# Patient Record
Sex: Female | Born: 1980 | Race: Black or African American | Hispanic: No | Marital: Single | Smoking: Never smoker
Health system: Southern US, Community
[De-identification: ages and names within clinical notes are randomized; demographics above are authoritative.]

## PROBLEM LIST (undated history)

## (undated) DIAGNOSIS — R519 Headache, unspecified: Secondary | ICD-10-CM

## (undated) DIAGNOSIS — D649 Anemia, unspecified: Secondary | ICD-10-CM

## (undated) DIAGNOSIS — Z9889 Other specified postprocedural states: Secondary | ICD-10-CM

## (undated) DIAGNOSIS — Z86018 Personal history of other benign neoplasm: Secondary | ICD-10-CM

## (undated) DIAGNOSIS — R112 Nausea with vomiting, unspecified: Secondary | ICD-10-CM

## (undated) DIAGNOSIS — J45909 Unspecified asthma, uncomplicated: Secondary | ICD-10-CM

## (undated) DIAGNOSIS — R51 Headache: Secondary | ICD-10-CM

## (undated) DIAGNOSIS — Z8489 Family history of other specified conditions: Secondary | ICD-10-CM

## (undated) HISTORY — PX: UTERINE FIBROID SURGERY: SHX826

---

## 2009-11-11 HISTORY — PX: MYOMECTOMY: SHX85

## 2014-04-05 DIAGNOSIS — M1990 Unspecified osteoarthritis, unspecified site: Secondary | ICD-10-CM | POA: Insufficient documentation

## 2014-08-22 ENCOUNTER — Ambulatory Visit (INDEPENDENT_AMBULATORY_CARE_PROVIDER_SITE_OTHER)
Admission: RE | Admit: 2014-08-22 | Discharge: 2014-08-22 | Disposition: A | Discharge status: Home / Self Care | Payer: BC Managed Care – PPO | Source: Ambulatory Visit | Attending: Family Medicine | Admitting: Family Medicine

## 2014-08-22 ENCOUNTER — Ambulatory Visit (INDEPENDENT_AMBULATORY_CARE_PROVIDER_SITE_OTHER): Payer: BC Managed Care – PPO | Admitting: Family Medicine

## 2014-08-22 ENCOUNTER — Encounter: Payer: Self-pay | Admitting: Family Medicine

## 2014-08-22 VITALS — BP 118/82 | HR 87 | Ht 67.0 in | Wt 154.0 lb

## 2014-08-22 DIAGNOSIS — M533 Sacrococcygeal disorders, not elsewhere classified: Secondary | ICD-10-CM

## 2014-08-22 MED ORDER — DICLOFENAC SODIUM 2 % TD SOLN: TRANSDERMAL | Status: DC

## 2014-08-22 NOTE — Progress Notes (Signed)
  [facility] [place], [place] [address] Phone: [phone] Subjective:    I'm seeing this patient by the request  of:   Dr. Deloria Lair  CC: Low back pain  HDQ:[id number] Michelle Hardy is a 33 y.o. female coming in with complaint of low back pain. Patient states that she has been diagnosed with sacroiliitis previously. Patient has had flares over the course of multiple years in my severe flare was back in March. Patient went into the walk-in clinic  on October 1 for a chronic burning sensation she was having in the left hip area. Patient has had multiple times with she's had gone in and have either a prescription for prednisone or given an injection to help with the pain. Patient states he usually takes more between 5-7 days to completely resolved. Patient states that at this moment she is not having any significant pain but when it does occur she does have radicular symptoms going down her leg but denies any numbness. Patient is a severe pain is 9/10 and is unable to ambulate when it does occur she states. Patient is wondering what she can do to help prevent this pain and see if there is any further diagnosis that can be obtained.    Past medical history, social, surgical and family history all reviewed in electronic medical record.   Patient's family history is is consistent with her dad having rheumatoid arthritis.  Review of Systems: No headache, visual changes, nausea, vomiting, diarrhea, constipation, dizziness, abdominal pain, skin rash, fevers, chills, night sweats, weight loss, swollen lymph nodes, body aches, joint swelling, muscle aches, chest pain, shortness of breath, mood changes.   Objective Blood pressure 118/82, pulse 87, height 5\' 7"  (1.702 m), weight 154 lb (69.854 kg), last menstrual period 08/16/2014, SpO2 99.00%.  General: No apparent distress alert and oriented x3 mood and affect normal, dressed appropriately.  HEENT: Pupils equal,  extraocular movements intact  Respiratory: Patient's speak in full sentences and does not appear short of breath  Cardiovascular: No lower extremity edema, non tender, no erythema  Skin: Warm dry intact with no signs of infection or rash on extremities or on axial skeleton.  Abdomen: Soft nontender  Neuro: Cranial nerves II through XII are intact, neurovascularly intact in all extremities with 2+ DTRs and 2+ pulses.  Lymph: No lymphadenopathy of posterior or anterior cervical chain or axillae bilaterally.  Gait normal with good balance and coordination.  MSK:  Non tender with full range of motion and good stability and symmetric strength and tone of shoulders, elbows, wrist, hip, knee and ankles bilaterally.  Back Exam:  Inspection: Unremarkable  Motion: Flexion 45 deg, Extension 35 deg, Side Bending to 35 deg bilaterally,  Rotation to 45 deg bilaterally  SLR laying: Negative  XSLR laying: Negative  Palpable tenderness: Mildly tender over the right SI joint FABER: negative. Sensory change: Gross sensation intact to all lumbar and sacral dermatomes.  Reflexes: 2+ at both patellar tendons, 2+ at achilles tendons, Babinski's downgoing.  Strength at foot  Plantar-flexion: 5/5 Dorsi-flexion: 5/5 Eversion: 5/5 Inversion: 5/5  Leg strength  Quad: 5/5 Hamstring: 5/5 Hip flexor: 5/5 Hip abductors: 4/5  Gait unremarkable.     Impression and Recommendations:     This case required medical decision making of moderate complexity.

## 2014-08-22 NOTE — Assessment & Plan Note (Signed)
Patient seemed to have the pain resolved at this time. Patient is not having any significant radicular symptoms and on exam today has a negative straight leg test. Differential includes sacroiliac joint dysfunction and we'll keep sacroiliitis in line. We will order x-rays today the back as well as the sacroiliac joint to see if there is any sclerotic changes that could help [place] with that diagnosis. Patient denies any other systemic findings that usually go with sacroiliitis such as gastroenterology complaints, or anterior uveitis difficulties. No need for manipulation today.  Topical NSAIDs today and patient was given recommendation over-the-counter medications he can be beneficial. We discussed icing protocol ensured proper technique of home exercises which she'll do on a daily basis. Patient will followup in 2-3 weeks for further evaluation. Patient could be a candidate for osteopathic manipulation if necessary.

## 2014-08-22 NOTE — Patient Instructions (Signed)
Good to meet you Ice 20 minutes 2 times daily. Usually after activity and before bed. Exercises  Daily Sacroiliac Joint Mobilization and Rehab 1. Work on pretzel stretching, shoulder back and leg draped in front. 3-5 sets, 30 sec.. 2. hip abductor rotations. standing, hip flexion and rotation outward then inward. 3 sets, 15 reps. when can do comfortably, add ankle weights starting at 2 pounds.  3. cross over stretching - shoulder back to ground, same side leg crossover. 3-5 sets for 30 min..  4. rolling up and back knees to chest and rocking. 5. sacral tilt - 5 sets, hold for 5-10 seconds Exercises on wall.  Heel and butt touching.  Raise leg 6 inches and hold 2 seconds.  Down slow for count of 4 seconds.  1 set of 30 reps daily on both sides.  Pennsaid twice daily as needed VItamin D 2000 IU daily Turmeric 500mg  twice daily See me again in 2 weeks.

## 2014-09-09 ENCOUNTER — Ambulatory Visit: Payer: BC Managed Care – PPO | Admitting: Family Medicine

## 2014-09-09 DIAGNOSIS — Z0289 Encounter for other administrative examinations: Secondary | ICD-10-CM

## 2015-04-26 DIAGNOSIS — L309 Dermatitis, unspecified: Secondary | ICD-10-CM | POA: Insufficient documentation

## 2015-04-26 DIAGNOSIS — L858 Other specified epidermal thickening: Secondary | ICD-10-CM | POA: Insufficient documentation

## 2015-05-01 ENCOUNTER — Ambulatory Visit (INDEPENDENT_AMBULATORY_CARE_PROVIDER_SITE_OTHER): Payer: BLUE CROSS/BLUE SHIELD | Admitting: Family Medicine

## 2015-05-01 ENCOUNTER — Encounter: Payer: Self-pay | Admitting: Family Medicine

## 2015-05-01 VITALS — BP 130/82 | HR 107 | Temp 98.6°F | Resp 18 | Ht 67.75 in | Wt 154.2 lb

## 2015-05-01 DIAGNOSIS — Z23 Encounter for immunization: Secondary | ICD-10-CM

## 2015-05-01 DIAGNOSIS — M545 Low back pain, unspecified: Secondary | ICD-10-CM

## 2015-05-01 DIAGNOSIS — Z0000 Encounter for general adult medical examination without abnormal findings: Secondary | ICD-10-CM | POA: Diagnosis not present

## 2015-05-01 NOTE — Progress Notes (Signed)
Subjective:    Patient ID: Michelle Hardy, female    DOB: Oct 27, 1981, 34 y.o.   MRN: [id number]  HPI Chief Complaint  Patient presents with  . Annual Exam has intermittent low back "spasms" in the mornings (2:30 am). No pain Thurs, Fri and Sat the past week. History of orthopedic and rheumatoid evaluations without specific diagnosis.     -  Routine PAP and GYN exam by Dr. Amalia Hailey at [facility].       Presently on BCP, Ponstel and Spironolactone. Has      used Fluoxetine for premenstrual syndrome in the past.   Patient Active Problem List   Diagnosis Date Noted  . Keratosis pilaris 04/26/2015  . 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 Arthritis 04/05/2014    -  Asthma flares with extremes in hot or cold                     weather/temperatures - usually controled with Ventolin.  Past Surgical History  Procedure Laterality Date  . Myomectomy  2011   History   Social History  . Marital Status: Single    Spouse Name: N/A  . Number of Children: N/A  . Years of Education: N/A   Occupational History  . Not on file.   Social History Main Topics  . Smoking status: Never Smoker   . Smokeless tobacco: Not on file  . Alcohol Use: No  . Drug Use: No  . Sexual Activity: Not on file   Other Topics Concern  . Not on file   Social History Narrative  Job involves desk work primarily. No exercise regularly since September 2015 due to low back pain.   Family History  Problem Relation Age of Onset  . Fibromyalgia Mother   . Diabetes Father   . Arthritis Father    Outpatient Encounter Prescriptions as of 05/01/2015  Medication Sig Note  . budesonide-formoterol (SYMBICORT) 160-4.5 MCG/ACT inhaler Inhale 1 puff into the lungs 2 (two) times daily. 04/26/2015: Received from: [place]: Inhale into the lungs.  . Mefenamic Acid 250 MG CAPS Take 1 capsule by mouth as needed. 04/26/2015: Received from: [place]: Take by mouth.  . spironolactone (ALDACTONE) 50 MG tablet 1 tablet daily. 05/01/2015: Received from: External Pharmacy Received Sig:   . TRI-SPRINTEC 0.18/0.215/0.25 MG-35 MCG tablet 1 tablet daily. 05/01/2015: Received from: External Pharmacy Received Sig:    No facility-administered encounter medications on file as of 05/01/2015.    No Known Allergies   Review of Systems  Constitutional: Negative.   HENT: Negative.   Respiratory: Negative.        Occasional wheeze  Cardiovascular: Negative.   Gastrointestinal: Negative.   Endocrine: Negative.   Genitourinary: Negative.   Musculoskeletal: Positive for myalgias.  Neurological: Negative.   Psychiatric/Behavioral: Negative.        Irritability at beginning of the menstrual cycle - was on Fluoxetine in the past with good control. Will contact GYN about a refill.   BP 130/82 mmHg  Pulse 107  Temp(Src) 98.6 F (37 C) (Oral)  Resp 18  Ht 5' 7.75" (1.721 m)  Wt 154 lb 3.2 oz (69.945 kg)  BMI 23.62 kg/m2  SpO2 100%  LMP 04/12/2015     Objective:   Physical Exam  Constitutional: She is oriented to person, place, and time. She appears well-developed and well-nourished.  HENT:  Head: Normocephalic and atraumatic.  Right Ear: External  ear normal.  Left Ear: External ear normal.  Mouth/Throat: Oropharynx is clear and moist.  Eyes: Conjunctivae and EOM are normal. Pupils are equal, round, and reactive to light.  Neck: Normal range of motion. Neck supple.  Cardiovascular: Normal rate and intact distal pulses.   Pulmonary/Chest: Effort normal and breath sounds normal.  Abdominal: Soft. Bowel sounds are normal.  Genitourinary:  Exam deferred to GYN annually.  Musculoskeletal: Normal range of motion.  Soreness to palpate mid back musculature. Good ROM with some stiffness. No radiation to extremities.  Neurological: She is alert and oriented to person, place, and time. She has normal reflexes.  Skin:  Skin is warm and dry.  Psychiatric:  Mood swings with menstrual cycles.      Assessment & Plan:   1. Annual physical exam General health stable. Will get routine labs. Proceed with GYN evaluation annually. Given anticipatory guidance. May need to get back on Fluoxetine for PMDD. - CBC with Differential/Platelet - Comprehensive metabolic panel - Lipid panel - TSH  2. Midline low back pain without sciatica Recent mid to upper lumbar spasms in the mornings. Suspect muscular strain. May use moist heat and NSAID of choice. - Sed Rate (ESR)  3. Need for Tdap vaccination - Tdap vaccine greater than or equal to 7yo IM

## 2015-05-05 LAB — CBC WITH DIFFERENTIAL/PLATELET
BASOS ABS: 0 10*3/uL (ref 0.0–0.2)
Basos: 0 %
EOS (ABSOLUTE): 0.1 10*3/uL (ref 0.0–0.4)
EOS: 2 %
HEMOGLOBIN: 9.8 g/dL — AB (ref 11.1–15.9)
Hematocrit: 30.7 % — ABNORMAL LOW (ref 34.0–46.6)
Immature Grans (Abs): 0 10*3/uL (ref 0.0–0.1)
Immature Granulocytes: 0 %
LYMPHS: 31 %
Lymphocytes Absolute: 1.1 10*3/uL (ref 0.7–3.1)
MCH: 21.4 pg — ABNORMAL LOW (ref 26.6–33.0)
MCHC: 31.9 g/dL (ref 31.5–35.7)
MCV: 67 fL — ABNORMAL LOW (ref 79–97)
Monocytes Absolute: 0.3 10*3/uL (ref 0.1–0.9)
Monocytes: 9 %
NEUTROS PCT: 58 %
Neutrophils Absolute: 2 10*3/uL (ref 1.4–7.0)
Platelets: 373 10*3/uL (ref 150–379)
RBC: 4.58 x10E6/uL (ref 3.77–5.28)
RDW: 18.5 % — ABNORMAL HIGH (ref 12.3–15.4)
WBC: 3.4 10*3/uL (ref 3.4–10.8)

## 2015-05-05 LAB — SEDIMENTATION RATE: SED RATE: 42 mm/h — AB (ref 0–32)

## 2015-05-06 LAB — COMPREHENSIVE METABOLIC PANEL
ALT: 10 IU/L (ref 0–32)
AST: 11 IU/L (ref 0–40)
Albumin/Globulin Ratio: 1.3 (ref 1.1–2.5)
Albumin: 4.2 g/dL (ref 3.5–5.5)
Alkaline Phosphatase: 45 IU/L (ref 39–117)
BUN / CREAT RATIO: 9 (ref 8–20)
BUN: 7 mg/dL (ref 6–20)
Bilirubin Total: <0.2 mg/dL (ref 0.0–1.2)
CALCIUM: 9.5 mg/dL (ref 8.7–10.2)
CO2: 22 mmol/L (ref 18–29)
Chloride: 100 mmol/L (ref 97–108)
Creatinine, Ser: 0.74 mg/dL (ref 0.57–1.00)
GFR calc Af Amer: 122 mL/min/{1.73_m2} (ref >59)
GFR, EST NON AFRICAN AMERICAN: 106 mL/min/{1.73_m2} (ref >59)
GLUCOSE: 79 mg/dL (ref 65–99)
Globulin, Total: 3.2 g/dL (ref 1.5–4.5)
Potassium: 4.7 mmol/L (ref 3.5–5.2)
Sodium: 137 mmol/L (ref 134–144)
TOTAL PROTEIN: 7.4 g/dL (ref 6.0–8.5)

## 2015-05-06 LAB — LIPID PANEL
CHOLESTEROL TOTAL: 174 mg/dL (ref 100–199)
Chol/HDL Ratio: 3 ratio units (ref 0.0–4.4)
HDL: 58 mg/dL (ref >39)
LDL Calculated: 99 mg/dL (ref 0–99)
Triglycerides: 87 mg/dL (ref 0–149)
VLDL Cholesterol Cal: 17 mg/dL (ref 5–40)

## 2015-05-06 LAB — TSH: TSH: 3.04 u[IU]/mL (ref 0.450–4.500)

## 2015-05-08 ENCOUNTER — Telehealth: Payer: Self-pay

## 2015-05-08 NOTE — Telephone Encounter (Signed)
Patient advised as directed below. Patient verbalized understanding and agrees with treatment plan.   Patient states she will stop by the office on Friday to pick up OC-Light kit and lab requisition.

## 2015-05-08 NOTE — Telephone Encounter (Signed)
-----  Message from Margo Common, [place] sent at 05/05/2015  6:19 PM EDT ----- Sedimentation rate elevated which indicates inflammation in back. May use Ibuprofen or Aleve with Percogesic for spasms and applications of moist heating pad. Hgb and Hct low (anemic). Recommend coming by to pick up an OC-Light kit to test stools for blood at home. Also need to get blood tests for iron.

## 2015-05-12 ENCOUNTER — Telehealth: Payer: Self-pay

## 2015-05-12 NOTE — Telephone Encounter (Signed)
Left message on machine at home to call back if no better on present regimen.

## 2015-05-12 NOTE — Telephone Encounter (Signed)
Patient came in today to pick up [place], and she reports that her upper back pain is getting worse. She reports that over the past 3 days she has done what you recommended (moist heat, OTC Ibuprofen, etc.), but she has not had any relief. Patient reports that it is becoming more difficult to move her head from side to side. Patient wants to know does she give current treatment a little more time, or is there something else that we can do? Please advise. Contact number listed in chart is correct. Thanks!

## 2015-05-18 ENCOUNTER — Other Ambulatory Visit (INDEPENDENT_AMBULATORY_CARE_PROVIDER_SITE_OTHER): Payer: BLUE CROSS/BLUE SHIELD | Admitting: Family Medicine

## 2015-05-18 DIAGNOSIS — D649 Anemia, unspecified: Secondary | ICD-10-CM | POA: Diagnosis not present

## 2015-05-18 LAB — IFOBT (OCCULT BLOOD): IFOBT: NEGATIVE

## 2015-05-19 NOTE — Progress Notes (Signed)
No evidence of blood in stools. Recommend taking Slow-Fe (OTC iron) qd and recheck blood count in a month. Recheck sooner if needed.

## 2015-05-19 NOTE — Progress Notes (Signed)
Patient advised as directed below. Patient verbalized understanding.  

## 2015-06-12 ENCOUNTER — Other Ambulatory Visit: Payer: Self-pay

## 2015-06-12 ENCOUNTER — Ambulatory Visit (INDEPENDENT_AMBULATORY_CARE_PROVIDER_SITE_OTHER): Payer: BLUE CROSS/BLUE SHIELD | Admitting: Family Medicine

## 2015-06-12 ENCOUNTER — Encounter: Payer: Self-pay | Admitting: Family Medicine

## 2015-06-12 VITALS — BP 102/64 | HR 68 | Temp 98.3°F | Resp 14 | Wt 155.8 lb

## 2015-06-12 DIAGNOSIS — L02429 Furuncle of limb, unspecified: Secondary | ICD-10-CM | POA: Diagnosis not present

## 2015-06-12 MED ORDER — SULFAMETHOXAZOLE-TRIMETHOPRIM 800-160 MG PO TABS: 1.0000 | ORAL_TABLET | Freq: Two times a day (BID) | ORAL | Status: DC

## 2015-06-12 NOTE — Progress Notes (Signed)
Patient ID: Michelle Hardy, female   DOB: [birth year], 34 y.o.   MRN: [id number]   Patient: Michelle Hardy Female    DOB: January 08, 1981   34 y.o.   MRN: [id number] Visit Date: 06/12/2015  Today's Provider: Vernie Murders, PA   Chief Complaint  Patient presents with  . Mass    under right arm X 5 days, No pain   Subjective:    HPI This 34 year old female had someone notice a lump under the right arm that is not painful or red. Last breast exam by Dr. Kenton Kingfisher with PAP was over a year ago. Never any previous lumps or masses under arms or in breasts.    History reviewed. No pertinent past medical history. Patient Active Problem List   Diagnosis Date Noted  . Keratosis pilaris 04/26/2015  . 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 Arthritis 04/05/2014   Past Surgical History  Procedure Laterality Date  . Myomectomy  2011   Family History  Problem Relation Age of Onset  . Fibromyalgia Mother   . Diabetes Father   . Arthritis Father    No Known Allergies  Previous Medications   BUDESONIDE-FORMOTEROL (SYMBICORT) 160-4.5 MCG/ACT INHALER    Inhale 1 puff into the lungs 2 (two) times daily.   DENTA 5000 PLUS 1.1 % CREA DENTAL CREAM       FLUOCINOLONE ACETONIDE SCALP 0.01 % OIL       KETOCONAZOLE (NIZORAL) 2 % SHAMPOO       MEFENAMIC ACID 250 MG CAPS    Take 1 capsule by mouth as needed.   SPIRONOLACTONE (ALDACTONE) 50 MG TABLET    1 tablet daily.   TRI-SPRINTEC 0.18/0.215/0.25 MG-35 MCG TABLET    1 tablet daily.    Review of Systems  All other systems reviewed and are negative.   History  Substance Use Topics  . Smoking status: Never Smoker   . Smokeless tobacco: Not on file  . Alcohol Use: No   Objective:   BP 102/64 mmHg  Pulse 68  Temp(Src) 98.3 F (36.8 C) (Oral)  Resp 14  Wt 155 lb 12.8 oz (70.67 kg)  LMP 06/08/2015  Physical Exam  Constitutional: She is oriented to person, place, and time. She appears well-developed and  well-nourished. No distress.  HENT:  Head: Normocephalic and atraumatic.  Right Ear: Hearing normal.  Left Ear: Hearing normal.  Nose: Nose normal.  Eyes: Conjunctivae and lids are normal. Right eye exhibits no discharge. Left eye exhibits no discharge. No scleral icterus.  Pulmonary/Chest: Effort normal. No respiratory distress.  Musculoskeletal: Normal range of motion.  Neurological: She is alert and oriented to person, place, and time.  Skin: Skin is intact. No lesion and no rash noted.  Small furuncle/large pimple in the right axilla. No local lymphadenopathy.  Psychiatric: She has a normal mood and affect. Her speech is normal and behavior is normal. Thought content normal.      Assessment & Plan:     1. Furuncle of axilla Recent discovery without discomfort. Some slight purulent discharge expressed. No local lymphadenopathy. Will treat with antibiotic and warm compressed. Discard razors recently used and roll-on deodorants. Recheck after finishing antibiotic if needed.

## 2016-11-22 ENCOUNTER — Other Ambulatory Visit: Payer: Self-pay

## 2016-11-22 ENCOUNTER — Encounter: Payer: Self-pay | Admitting: Family Medicine

## 2016-11-22 ENCOUNTER — Ambulatory Visit (INDEPENDENT_AMBULATORY_CARE_PROVIDER_SITE_OTHER): Payer: 59 | Admitting: Family Medicine

## 2016-11-22 VITALS — BP 102/60 | HR 83 | Temp 98.1°F | Resp 14 | Ht 68.0 in | Wt 158.8 lb

## 2016-11-22 DIAGNOSIS — J4520 Mild intermittent asthma, uncomplicated: Secondary | ICD-10-CM | POA: Diagnosis not present

## 2016-11-22 DIAGNOSIS — D259 Leiomyoma of uterus, unspecified: Secondary | ICD-10-CM

## 2016-11-22 DIAGNOSIS — Z0000 Encounter for general adult medical examination without abnormal findings: Secondary | ICD-10-CM | POA: Diagnosis not present

## 2016-11-22 DIAGNOSIS — J45909 Unspecified asthma, uncomplicated: Secondary | ICD-10-CM | POA: Insufficient documentation

## 2016-11-22 MED ORDER — ALBUTEROL SULFATE HFA 108 (90 BASE) MCG/ACT IN AERS: 2.0000 | INHALATION_SPRAY | Freq: Four times a day (QID) | RESPIRATORY_TRACT | 0 refills | Status: DC | PRN

## 2016-11-22 NOTE — Progress Notes (Signed)
Patient: Michelle Hardy, Female    DOB: 08-09-1981, 36 y.o.   MRN: [id number] Visit Date: 11/22/2016  Today's Provider: Vernie Murders, PA   Chief Complaint  Patient presents with  . Annual Exam   Subjective:    Annual physical exam Michelle Hardy is a 36 y.o. female who presents today for health maintenance and complete physical. She feels fairly well. She reports exercising none. She reports she is sleeping poorly.  -----------------------------------------------------------------   Review of Systems  Constitutional: Negative.   HENT: Positive for nosebleeds and rhinorrhea.   Eyes: Negative.   Respiratory: Positive for cough, chest tightness, shortness of breath and wheezing.   Cardiovascular: Negative.   Gastrointestinal: Positive for constipation.  Endocrine: Positive for polydipsia and polyphagia.  Genitourinary: Negative.   Musculoskeletal: Positive for back pain.  Skin: Negative.   Allergic/Immunologic: Negative.   Neurological: Negative.   Hematological: Negative.   Psychiatric/Behavioral: Positive for sleep disturbance.    Social History      She  reports that she has never smoked. She does not have any smokeless tobacco history on file. She reports that she does not drink alcohol or use drugs.       Social History   Social History  . Marital status: Single    Spouse name: N/A  . Number of children: N/A  . Years of education: N/A   Social History Main Topics  . Smoking status: Never Smoker  . Smokeless tobacco: None  . Alcohol use No  . Drug use: No  . Sexual activity: Not Asked   Other Topics Concern  . None   Social History Narrative  . None    No past medical history on file.   Patient Active Problem List   Diagnosis Date Noted  . Keratosis pilaris 04/26/2015  . 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 Arthritis 04/05/2014    Past Surgical History:  Procedure Laterality Date  .  MYOMECTOMY  2011    Family History        Family Status  Relation Status  . Mother Alive  . Father Alive        Her family history includes Arthritis in her father; Diabetes in her father; Fibromyalgia in her mother.     No Known Allergies   Current Outpatient Prescriptions:  .  budesonide-formoterol (SYMBICORT) 160-4.5 MCG/ACT inhaler, Inhale 1 puff into the lungs 2 (two) times daily., Disp: , Rfl:  .  DENTA 5000 PLUS 1.1 % CREA dental cream, , Disp: , Rfl: 2 .  FLUOCINOLONE ACETONIDE SCALP 0.01 % OIL, , Disp: , Rfl: 1 .  ketoconazole (NIZORAL) 2 % shampoo, , Disp: , Rfl: 8 .  Mefenamic Acid 250 MG CAPS, Take 1 capsule by mouth as needed., Disp: , Rfl:  .  spironolactone (ALDACTONE) 50 MG tablet, 1 tablet daily., Disp: , Rfl: 1 .  TRI-SPRINTEC 0.18/0.215/0.25 MG-35 MCG tablet, 1 tablet daily., Disp: , Rfl: 1   Patient Care Team: Margo Common, PA as PCP - General (Physician Assistant)      Objective:   Vitals: BP 102/60 (BP Location: Right Arm, Patient Position: Sitting, Cuff Size: Normal)   Pulse 83   Temp 98.1 F (36.7 C) (Oral)   Resp 14   Ht 5\' 8"  (1.727 m)   Wt 158 lb 12.8 oz (72 kg)   SpO2 99%   BMI 24.15 kg/m    Physical Exam  Constitutional: She is oriented to  person, place, and time. She appears well-developed and well-nourished.  HENT:  Head: Normocephalic.  Right Ear: External ear normal.  Left Ear: External ear normal.  Nose: Nose normal.  Mouth/Throat: Oropharynx is clear and moist.  Eyes: Conjunctivae are normal. Pupils are equal, round, and reactive to light.  Neck: Neck supple.  Cardiovascular: Normal rate, regular rhythm and intact distal pulses.   Pulmonary/Chest: Breath sounds normal. She is in respiratory distress.  Abdominal: Soft. Bowel sounds are normal. She exhibits mass.  Left midline small orange size mass palpable that is firm. Patient states this is the fibroid her GYN is following and planning a recheck ultrasound on in the next  week or two. Some soreness but no local lymphadenopathy palpable.  Genitourinary:  Genitourinary Comments: Deferred to her GYN (Dr. Kenton Kingfisher at [facility]).  Musculoskeletal: Normal range of motion.  Lymphadenopathy:    She has no cervical adenopathy.  Neurological: She is alert and oriented to person, place, and time. She has normal reflexes.  Skin: No rash noted.  Psychiatric: She has a normal mood and affect. Her behavior is normal. Thought content normal.   Depression Screen PHQ 2/9 Scores 11/22/2016  PHQ - 2 Score 0    Assessment & Plan:     Routine Health Maintenance and Physical Exam  Exercise Activities and Dietary recommendations Goals    Recommend low fat diet and regular exercise for 30 minutes 3-4 days a week.      Immunization History  Administered Date(s) Administered  . Tdap 05/01/2015    Health Maintenance  Topic Date Due  . HIV Screening  05/19/1995  . PAP SMEAR  05/18/2001  . INFLUENZA VACCINE  06/11/2016  . TETANUS/TDAP  04/30/2025     Discussed health benefits of physical activity, and encouraged her to engage in regular exercise appropriate for her age and condition.    -------------------------------------------------------------------- 1. Annual physical exam General health good. Will get routine labs. Has had PAP and GYN follow up routinely with Dr. Kenton Kingfisher [facility]). Follow up pending lab reports. Given anticipatory guidance. - CBC with Differential/Platelet - Comprehensive metabolic panel - TSH - Lipid panel  2. Uterine leiomyoma, unspecified location Large mass in the left midline suprapubic region. Having heavy menses and history of some anemia. Will recheck CBC and encouraged to proceed with ultrasound evaluation as planned by her GYN. - CBC with Differential/Platelet  3. Mild intermittent reactive airway disease without complication Spirometry shows moderate restrictive disease. Has been using Symbicort inhaler more than twice a day  because of hacking cough and slight wheeze. Recommend adding Albuterol for rescue from wheezing. - CBC with Differential/Platelet - Spirometry with graph - albuterol (PROVENTIL HFA;VENTOLIN HFA) 108 (90 Base) MCG/ACT inhaler; Inhale 2 puffs into the lungs every 6 (six) hours as needed for wheezing or shortness of breath.  Dispense: 1 Inhaler; Refill: [place], [place]  [facility]

## 2016-11-26 LAB — CBC WITH DIFFERENTIAL/PLATELET
BASOS: 0 %
Basophils Absolute: 0 10*3/uL (ref 0.0–0.2)
EOS (ABSOLUTE): 0 10*3/uL (ref 0.0–0.4)
EOS: 1 %
HEMOGLOBIN: 10.5 g/dL — AB (ref 11.1–15.9)
Hematocrit: 33.4 % — ABNORMAL LOW (ref 34.0–46.6)
Immature Grans (Abs): 0 10*3/uL (ref 0.0–0.1)
Immature Granulocytes: 0 %
LYMPHS ABS: 1.1 10*3/uL (ref 0.7–3.1)
Lymphs: 31 %
MCH: 22.9 pg — ABNORMAL LOW (ref 26.6–33.0)
MCHC: 31.4 g/dL — ABNORMAL LOW (ref 31.5–35.7)
MCV: 73 fL — AB (ref 79–97)
MONOCYTES: 7 %
Monocytes Absolute: 0.3 10*3/uL (ref 0.1–0.9)
Neutrophils Absolute: 2.2 10*3/uL (ref 1.4–7.0)
Neutrophils: 61 %
Platelets: 337 10*3/uL (ref 150–379)
RBC: 4.59 x10E6/uL (ref 3.77–5.28)
RDW: 19 % — ABNORMAL HIGH (ref 12.3–15.4)
WBC: 3.6 10*3/uL (ref 3.4–10.8)

## 2016-11-26 LAB — COMPREHENSIVE METABOLIC PANEL
A/G RATIO: 1.3 (ref 1.2–2.2)
ALT: 10 IU/L (ref 0–32)
AST: 9 IU/L (ref 0–40)
Albumin: 4 g/dL (ref 3.5–5.5)
Alkaline Phosphatase: 47 IU/L (ref 39–117)
BUN/Creatinine Ratio: 11 (ref 9–23)
BUN: 7 mg/dL (ref 6–20)
Bilirubin Total: 0.3 mg/dL (ref 0.0–1.2)
CO2: 25 mmol/L (ref 18–29)
CREATININE: 0.65 mg/dL (ref 0.57–1.00)
Calcium: 8.8 mg/dL (ref 8.7–10.2)
Chloride: 103 mmol/L (ref 96–106)
GFR calc Af Amer: 132 mL/min/{1.73_m2} (ref >59)
GFR calc non Af Amer: 115 mL/min/{1.73_m2} (ref >59)
GLOBULIN, TOTAL: 3.1 g/dL (ref 1.5–4.5)
Glucose: 81 mg/dL (ref 65–99)
Potassium: 4.2 mmol/L (ref 3.5–5.2)
SODIUM: 139 mmol/L (ref 134–144)
Total Protein: 7.1 g/dL (ref 6.0–8.5)

## 2016-11-26 LAB — LIPID PANEL
Chol/HDL Ratio: 3.8 ratio units (ref 0.0–4.4)
Cholesterol, Total: 197 mg/dL (ref 100–199)
HDL: 52 mg/dL (ref >39)
LDL CALC: 134 mg/dL — AB (ref 0–99)
Triglycerides: 54 mg/dL (ref 0–149)
VLDL CHOLESTEROL CAL: 11 mg/dL (ref 5–40)

## 2016-11-26 LAB — TSH: TSH: 1.33 u[IU]/mL (ref 0.450–4.500)

## 2016-11-29 ENCOUNTER — Telehealth: Payer: Self-pay

## 2016-11-29 NOTE — Telephone Encounter (Signed)
-----   Message from Margo Common, [place] sent at 11/29/2016  3:21 PM EST ----- May let patient know of CBC results and adding iron testing.

## 2016-11-29 NOTE — Telephone Encounter (Signed)
Pt advised. I advised patient if [facility] can not use the specimen from her last labs she would need to have it drawn. She also wanted to mention that she has been taking Iron tablets when she went to get labs done-aa

## 2016-11-29 NOTE — Telephone Encounter (Signed)
[id number] 

## 2016-11-29 NOTE — Progress Notes (Signed)
May let patient know of CBC results and adding iron testing.

## 2016-12-03 ENCOUNTER — Telehealth: Payer: Self-pay

## 2016-12-03 LAB — SPECIMEN STATUS REPORT

## 2016-12-03 LAB — IRON AND TIBC
Iron Saturation: 8 % — CL (ref 15–55)
Iron: 34 ug/dL (ref 27–159)
Total Iron Binding Capacity: 415 ug/dL (ref 250–450)
UIBC: 381 ug/dL (ref 131–425)

## 2016-12-03 NOTE — Telephone Encounter (Signed)
Advised pt of lab results. Pt verbally acknowledges understanding. Emily Drozdowski, CMA   

## 2016-12-03 NOTE — Telephone Encounter (Signed)
[id number]

## 2016-12-03 NOTE — Telephone Encounter (Signed)
-----   Message from [place], [place] sent at 12/02/2016  5:43 PM EST ----- Iron level is low normal but the iron saturation level is very low. Need Ferrous Sulfate 325 mg qd #30 and recheck levels in a month.

## 2016-12-06 ENCOUNTER — Encounter: Payer: Self-pay | Admitting: Family Medicine

## 2017-01-03 ENCOUNTER — Encounter: Payer: Self-pay | Admitting: Family Medicine

## 2017-01-03 ENCOUNTER — Ambulatory Visit (INDEPENDENT_AMBULATORY_CARE_PROVIDER_SITE_OTHER): Payer: 59 | Admitting: Family Medicine

## 2017-01-03 VITALS — BP 102/64 | HR 82 | Temp 98.5°F | Resp 14 | Wt 157.4 lb

## 2017-01-03 DIAGNOSIS — K5903 Drug induced constipation: Secondary | ICD-10-CM

## 2017-01-03 DIAGNOSIS — D259 Leiomyoma of uterus, unspecified: Secondary | ICD-10-CM | POA: Diagnosis not present

## 2017-01-03 DIAGNOSIS — D500 Iron deficiency anemia secondary to blood loss (chronic): Secondary | ICD-10-CM

## 2017-01-03 DIAGNOSIS — D509 Iron deficiency anemia, unspecified: Secondary | ICD-10-CM | POA: Insufficient documentation

## 2017-01-03 NOTE — Patient Instructions (Signed)

## 2017-01-03 NOTE — Progress Notes (Signed)
Patient: Michelle Hardy Female    DOB: Mar 15, 1981   36 y.o.   MRN: [id number] Visit Date: 01/03/2017  Today's Provider: Vernie Murders, PA   Chief Complaint  Patient presents with  . Follow-up   Subjective:    HPI Patient is here today for a 6 week follow up. Lab were done on 11/22/2016 and iron saturation levels were very low at 8% with iron level 34 ug/dl. Patient was advised to start Ferrous Sulfate 325 mg and recheck labs. Patient reports fair compliance with treatment plan. She states only being able to take medication twice weekly due to constipation. She has tried taking dulcolax with iron,but was causing GI issues. The iron causes dark stools. Having heavy menses every month that lasts a week. GYN (Dr. Kenton Kingfisher) evaluating for possible myomectomy again for uterine fibroids. BCP (Tri-Sprintec) not lightening the menses.  Patient Active Problem List   Diagnosis Date Noted  . Reactive airway disease 11/22/2016  . Keratosis pilaris 04/26/2015  . 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 Arthritis 04/05/2014   Past Surgical History:  Procedure Laterality Date  . MYOMECTOMY  2011   Family History  Problem Relation Age of Onset  . Fibromyalgia Mother   . Diabetes Father   . Arthritis Father    No Known Allergies   Previous Medications   ALBUTEROL (PROVENTIL HFA;VENTOLIN HFA) 108 (90 BASE) MCG/ACT INHALER    Inhale 2 puffs into the lungs every 6 (six) hours as needed for wheezing or shortness of breath.   BUDESONIDE-FORMOTEROL (SYMBICORT) 160-4.5 MCG/ACT INHALER    Inhale 1 puff into the lungs 2 (two) times daily.   DENTA 5000 PLUS 1.1 % CREA DENTAL CREAM       FERROUS SULFATE 325 (65 FE) MG TABLET    Take 325 mg by mouth daily with breakfast. Patient is only able to take twice weekly due to constipation   FLUOCINOLONE ACETONIDE SCALP 0.01 % OIL       KETOCONAZOLE (NIZORAL) 2 % SHAMPOO       MEFENAMIC ACID 250 MG CAPS    Take 1  capsule by mouth as needed.   SPIRONOLACTONE (ALDACTONE) 50 MG TABLET    1 tablet daily.   TRI-SPRINTEC 0.18/0.215/0.25 MG-35 MCG TABLET    1 tablet daily.    Review of Systems  Constitutional: Positive for fatigue.  Respiratory: Negative.   Cardiovascular: Negative.     Social History  Substance Use Topics  . Smoking status: Never Smoker  . Smokeless tobacco: Never Used  . Alcohol use No   Objective:   BP 102/64 (BP Location: Right Arm, Patient Position: Sitting, Cuff Size: Normal)   Pulse 82   Temp 98.5 F (36.9 C) (Oral)   Resp 14   Wt 157 lb 6.4 oz (71.4 kg)   SpO2 99%   BMI 23.93 kg/m   Physical Exam  Constitutional: She is oriented to person, place, and time. She appears well-developed and well-nourished. No distress.  HENT:  Head: Normocephalic and atraumatic.  Right Ear: Hearing normal.  Left Ear: Hearing normal.  Nose: Nose normal.  Eyes: Conjunctivae and lids are normal. Right eye exhibits no discharge. Left eye exhibits no discharge. No scleral icterus.  Neck: Neck supple.  Cardiovascular: Normal rate and regular rhythm.   Pulmonary/Chest: Effort normal and breath sounds normal. No respiratory distress.  Abdominal: Soft. Bowel sounds are normal.  Musculoskeletal: Normal range of motion.  Neurological: She is alert and oriented to person,  place, and time.  Skin: Skin is intact. No lesion and no rash noted.  Psychiatric: She has a normal mood and affect. Her speech is normal and behavior is normal. Thought content normal.      Assessment & Plan:     1. Iron deficiency anemia due to chronic blood loss Having difficulty maintaining a regular dose of iron due to constipation. In the process of having uterine fibroids evaluated for possible myomectomy per Dr. Kenton Kingfisher. Will recheck labs and follow up pending reports. - CBC with Differential/Platelet - Iron - Iron Binding Cap (TIBC)  2. Uterine leiomyoma, unspecified location Has not had much help from BCP to  decrease heavy menses. Had one myomectomy in 2011. Dr. Kenton Kingfisher (GYN) planning follow up ultrasound. Will recheck CBC and iron levels. - CBC with Differential/Platelet  3. Drug-induced constipation Iron supplements causing constipation (BM twice a week of hard dry stool). Tried Dulcolax but causes abdominal cramping pain. Recommend increase water intake, Probiotic Supplement and switch to Miralax prn with stool softener daily. Recheck prn. May need treatment with Linzess or Movantik for chronic constipation.

## 2017-01-04 LAB — CBC WITH DIFFERENTIAL/PLATELET
BASOS ABS: 0 10*3/uL (ref 0.0–0.2)
Basos: 0 %
EOS (ABSOLUTE): 0 10*3/uL (ref 0.0–0.4)
Eos: 1 %
Hematocrit: 35.2 % (ref 34.0–46.6)
Hemoglobin: 11 g/dL — ABNORMAL LOW (ref 11.1–15.9)
IMMATURE GRANS (ABS): 0 10*3/uL (ref 0.0–0.1)
Immature Granulocytes: 0 %
LYMPHS: 26 %
Lymphocytes Absolute: 0.9 10*3/uL (ref 0.7–3.1)
MCH: 23.1 pg — AB (ref 26.6–33.0)
MCHC: 31.3 g/dL — AB (ref 31.5–35.7)
MCV: 74 fL — ABNORMAL LOW (ref 79–97)
Monocytes Absolute: 0.3 10*3/uL (ref 0.1–0.9)
Monocytes: 7 %
NEUTROS ABS: 2.3 10*3/uL (ref 1.4–7.0)
Neutrophils: 66 %
Platelets: 357 10*3/uL (ref 150–379)
RBC: 4.77 x10E6/uL (ref 3.77–5.28)
RDW: 19.2 % — ABNORMAL HIGH (ref 12.3–15.4)
WBC: 3.5 10*3/uL (ref 3.4–10.8)

## 2017-01-04 LAB — IRON AND TIBC
IRON SATURATION: 13 % — AB (ref 15–55)
Iron: 57 ug/dL (ref 27–159)
TIBC: 452 ug/dL — AB (ref 250–450)
UIBC: 395 ug/dL (ref 131–425)

## 2017-01-06 ENCOUNTER — Telehealth: Payer: Self-pay

## 2017-01-06 NOTE — Telephone Encounter (Signed)
lmtcb Laurena Valko Drozdowski, CMA  

## 2017-01-06 NOTE — Telephone Encounter (Signed)
Pt informed and voiced understanding of results. 

## 2017-01-06 NOTE — Telephone Encounter (Signed)
-----   Message from Margo Common, [place] sent at 01/04/2017  9:35 AM EST ----- Slowly improving iron and hgb. Continue iron as tolerated and give the Miralax a try. Follow up appointment in 3 months.

## 2017-01-31 ENCOUNTER — Encounter: Payer: Self-pay | Admitting: Obstetrics & Gynecology

## 2017-01-31 ENCOUNTER — Encounter
Admission: RE | Admit: 2017-01-31 | Discharge: 2017-01-31 | Disposition: A | Discharge status: Home / Self Care | Payer: 59 | Source: Ambulatory Visit | Attending: Obstetrics & Gynecology | Admitting: Obstetrics & Gynecology

## 2017-01-31 ENCOUNTER — Ambulatory Visit (INDEPENDENT_AMBULATORY_CARE_PROVIDER_SITE_OTHER): Payer: 59 | Admitting: Obstetrics & Gynecology

## 2017-01-31 VITALS — BP 110/70 | HR 97 | Ht 67.0 in | Wt 155.0 lb

## 2017-01-31 DIAGNOSIS — N920 Excessive and frequent menstruation with regular cycle: Secondary | ICD-10-CM | POA: Diagnosis not present

## 2017-01-31 DIAGNOSIS — D251 Intramural leiomyoma of uterus: Secondary | ICD-10-CM | POA: Diagnosis not present

## 2017-01-31 DIAGNOSIS — Z01812 Encounter for preprocedural laboratory examination: Secondary | ICD-10-CM | POA: Diagnosis not present

## 2017-01-31 DIAGNOSIS — D649 Anemia, unspecified: Secondary | ICD-10-CM | POA: Diagnosis not present

## 2017-01-31 DIAGNOSIS — D259 Leiomyoma of uterus, unspecified: Secondary | ICD-10-CM | POA: Insufficient documentation

## 2017-01-31 DIAGNOSIS — D250 Submucous leiomyoma of uterus: Secondary | ICD-10-CM | POA: Insufficient documentation

## 2017-01-31 DIAGNOSIS — D500 Iron deficiency anemia secondary to blood loss (chronic): Secondary | ICD-10-CM | POA: Diagnosis not present

## 2017-01-31 HISTORY — DX: Personal history of other benign neoplasm: Z86.018

## 2017-01-31 HISTORY — DX: Anemia, unspecified: D64.9

## 2017-01-31 HISTORY — DX: Headache: R51

## 2017-01-31 HISTORY — DX: Headache, unspecified: R51.9

## 2017-01-31 HISTORY — DX: Other specified postprocedural states: R11.2

## 2017-01-31 HISTORY — DX: Family history of other specified conditions: Z84.89

## 2017-01-31 HISTORY — DX: Unspecified asthma, uncomplicated: J45.909

## 2017-01-31 HISTORY — DX: Other specified postprocedural states: Z98.890

## 2017-01-31 HISTORY — DX: Nausea with vomiting, unspecified: R11.2

## 2017-01-31 LAB — CBC
HCT: 33.6 % — ABNORMAL LOW (ref 35.0–47.0)
HEMOGLOBIN: 11.1 g/dL — AB (ref 12.0–16.0)
MCH: 24.6 pg — AB (ref 26.0–34.0)
MCHC: 33 g/dL (ref 32.0–36.0)
MCV: 74.6 fL — ABNORMAL LOW (ref 80.0–100.0)
Platelets: 329 10*3/uL (ref 150–440)
RBC: 4.51 MIL/uL (ref 3.80–5.20)
RDW: 18.8 % — AB (ref 11.5–14.5)
WBC: 3.9 10*3/uL (ref 3.6–11.0)

## 2017-01-31 LAB — TYPE AND SCREEN
ABO/RH(D): AB POS
Antibody Screen: NEGATIVE

## 2017-01-31 LAB — POTASSIUM: POTASSIUM: 3.9 mmol/L (ref 3.5–5.1)

## 2017-01-31 NOTE — Patient Instructions (Signed)
Myomectomy Myomectomy is surgery to remove a noncancerous tumor (myoma) from the uterus. Myomas are tumors made up of fibrous tissue. They are often called fibroid tumors. Fibroid tumors can range from the size of a pea to the size of a grapefruit. In a myomectomy, the fibroid tumor is removed without removing the uterus. Because these tumors are rarely cancerous, this surgery is usually done only if the tumor is growing or causing symptoms such as pain, pressure, bleeding, or pain with intercourse. LET YOUR HEALTH CARE PROVIDER KNOW ABOUT:  Any allergies you have.  All medicines you are taking, including vitamins, herbs, eye drops, creams, and over-the-counter medicines.  Previous problems you or members of your family have had with the use of anesthetics.  Any blood disorders you have.  Previous surgeries you have had.  Medical conditions you have. RISKS AND COMPLICATIONS Generally, this is a safe procedure. However, as with any procedure, complications can occur. Possible complications include:  Excessive bleeding.  Infection.  Injury to nearby organs.  Blood clots in the legs, chest, or brain.  Scar tissue on other organs and in the pelvis. This may require another surgery to remove the scar tissue.  BEFORE THE PROCEDURE  Ask your health care provider about changing or stopping your regular medicines. Avoid taking aspirin or blood thinners as directed by your health care provider.  Do not  eat or drink anything after midnight on the night before surgery.  If you smoke, do not  smoke for 2 weeks before the surgery.  Do not  drink alcohol the day before the surgery.  Arrange for someone to drive you home after the procedure or after your hospital stay. Also arrange for someone to help you with activities during your recovery. PROCEDURE You will be given medicine to make you sleep through the procedure (general anesthetic). Any of the following methods may be used to perform  a myomectomy:  Small monitors will be put on your body. They are used to check your heart, blood pressure, and oxygen level.  An IV access tube will be put into one of your veins. Medicine will be able to flow directly into your body through this IV tube.  You might be given a medicine to help you relax (sedative).  You will be given a medicine to make you sleep (general anesthetic). A breathing tube will be placed into your lungs during the procedure.  A thin, flexible tube (catheter) will be inserted into your bladder to collect urine.  Any of the following methods may be used to perform a myomectomy: ? Hysteroscopic myomectomy-This method may be used when the fibroid tumor is inside the cavity of the uterus. A long, thin tube that is like a telescope (hysteroscope) is inserted inside the uterus. A saline solution is put into your uterus. This expands the uterus and allows the surgeon to see the fibroids. Tools are passed through the hysteroscope to remove the fibroid tumor in pieces. ? Laparoscopic myomectomy-A few small cuts (incisions) are made in the lower abdomen. A thin, lighted tube with a tiny camera on the end (laparoscope) is inserted through one of the incisions. This gives the surgeon a good view of the area. The fibroid tumor is removed through the other incisions. The incisions are then closed with stitches (sutures) or staples. ? Abdominal myomectomy-This method is used when the fibroid tumor cannot be removed with a hysteroscope or laparoscope. The surgery is performed through a larger surgical incision in the abdomen. The   fibroid tumor is removed through this incision. The incision is closed with sutures or staples.  What to expect after the procedure  If you had a laparoscopic or hysteroscopic myomectomy, you may be able to go home the same day, or you may need to stay in the hospital overnight.  If you had an abdominal myomectomy, you may need to stay in the hospital for a  few days.  Your IV access tube and catheter will be removed in 1-2 days.  You may be given medicine for pain or to help you sleep.  You may be given an antibiotic medicine, if needed. This information is not intended to replace advice given to you by your health care provider. Make sure you discuss any questions you have with your health care provider. Document Released: 08/25/2007 Document Revised: 04/04/2016 Document Reviewed: 06/09/2013 Elsevier Interactive Patient Education  2017 Elsevier Inc[employer]  

## 2017-01-31 NOTE — Progress Notes (Signed)
HISTORY and PHYSICAL       [name] is a 35 y.o. G0P0000; Patient's last menstrual period was 01/13/2017., presents today for a ongoing heavy regular menses associated with fibroids.  She has had prior myomectomy and recent US has shown recurrence of at least 7 fibroids ranging from 23 to 56 mm.  She complains of menorrhagia that  began 6 months ago and its severity is described as severe.  She has regular periods every 28 days and they are associated with moderate menstrual cramping.  She has used the following for attempts at control: maxi pad and OCPs for hormonal attempt at control.  Pos clots, interferes with work and exercise.  Back pain.  Desires future fertility option.  She is not sexually active.  Contraception: OCP (estrogen/progesterone).  Helps w acne. Hx of STDs: none. She is premenopausal.  PMHx: She  has no past medical history on file. Also,  has a past surgical history that includes Myomectomy (2011)., family history includes Arthritis in her father; Diabetes in her father; Fibromyalgia in her mother.,  reports that she has never smoked. She has never used smokeless tobacco. She reports that she does not drink alcohol or use drugs.  She has a current medication list which includes the following prescription(s): spironolactone, tri-sprintec, albuterol, ibuprofen, iron, and multiple vitamins-minerals. Also, is allergic to coconut fatty acids.  Review of Systems  Constitutional: Negative for chills, fever and malaise/fatigue.  HENT: Negative for congestion, sinus pain and sore throat.   Eyes: Negative for blurred vision and pain.  Respiratory: Negative for cough and wheezing.   Cardiovascular: Negative for chest pain and leg swelling.  Gastrointestinal: Negative for abdominal pain, constipation, diarrhea, heartburn, nausea and vomiting.  Genitourinary: Negative for dysuria, frequency, hematuria and urgency.  Musculoskeletal: Negative for back pain, joint pain,  myalgias and neck pain.  Skin: Negative for itching and rash.  Neurological: Negative for dizziness, tremors and weakness.  Endo/Heme/Allergies: Does not bruise/bleed easily.  Psychiatric/Behavioral: Negative for depression. The patient is not nervous/anxious and does not have insomnia.     Objective: BP 110/70   Pulse 97   Ht 5\' 7"  (1.702 m)   Wt 155 lb (70.3 kg)   LMP 01/13/2017   BMI 24.28 kg/m  Physical Exam  Constitutional: She is oriented to person, place, and time. She appears well-developed and well-nourished. No distress.  Genitourinary: Rectum normal, vagina normal and uterus normal. Pelvic exam was performed with patient supine. There is no rash or lesion on the right labia. There is no rash or lesion on the left labia. Vagina exhibits no lesion. No bleeding in the vagina. Right adnexum does not display mass and does not display tenderness. Left adnexum does not display mass and does not display tenderness. Cervix does not exhibit motion tenderness, lesion, friability or polyp.   Uterus is mobile and midaxial. Uterus is not enlarged or exhibiting a mass.  HENT:  Head: Normocephalic and atraumatic. Head is without laceration.  Right Ear: Hearing normal.  Left Ear: Hearing normal.  Nose: No epistaxis.  No foreign bodies.  Mouth/Throat: Uvula is midline, oropharynx is clear and moist and mucous membranes are normal.  Eyes: Pupils are equal, round, and reactive to light.  Neck: Normal range of motion. Neck supple. No thyromegaly present.  Cardiovascular: Normal rate and regular rhythm.  Exam reveals no gallop and no friction rub.   No murmur heard. Pulmonary/Chest: Effort normal and breath sounds normal. No respiratory distress. She has no wheezes.  Abdominal: Soft. Bowel sounds are normal. She exhibits no distension. There is no tenderness. There is no rebound.  Musculoskeletal: Normal range of motion.  Neurological: She is alert and oriented to person, place, and time. No  cranial nerve deficit.  Skin: Skin is warm and dry.  Psychiatric: She has a normal mood and affect. Judgment normal.  Vitals reviewed.   ASSESSMENT/PLAN:  menorrhagia and uterine fibroids, anemia  Plan for open laparotomy/ Myomectomy.  I have had a careful discussion with Michelle Hardy about all the options available and the risk/benefits of each. I have fully informed this patient that surgery may subject her to a variety of discomforts and risks: She understands that most patients have surgery with little difficulty, but problems can happen ranging from minor to fatal. These include nausea, vomiting, pain, bleeding, infection, poor healing, hernia, or formation of adhesions. Unexpected reactions may occur from any drug or anesthetic given. Unintended injury may occur to other pelvic or abdominal structures such as Fallopian tubes, ovaries, bladder, ureter (tube from kidney to bladder), or bowel. Nerves going from the pelvis to the legs may be injured. Any such injury may require immediate or later additional surgery to correct the problem. Excessive blood loss requiring transfusion is very unlikely but possible. Dangerous blood clots may form in the legs or lungs. Physical and sexual activity will be restricted in varying degrees for an indeterminate period of time but most often 2-6 weeks.  Finally, she understands that it is impossible to list every possible undesirable effect and that the condition for which surgery is done is not always cured or significantly improved, and in rare cases may be even worse.Ample time was given to answer all questions.  Michelle Applebaum, MD, [facility], [facility] 01/31/2017  8:22 AM

## 2017-01-31 NOTE — Patient Instructions (Signed)
Your procedure is scheduled on: February 06, 2017 (Thursday) Report to Same Day Surgery 2nd floor medical mall [facility] Entrance-take elevator on left to 2nd floor.  Check in with surgery information desk.) To find out your arrival time please call [phone] between 1PM - 3PM on February 05, 2017 (Wednesday)  Remember: Instructions that are not followed completely may result in serious medical risk, up to and including death, or upon the discretion of your surgeon and anesthesiologist your surgery may need to be rescheduled.    _x___ 1. Do not eat food or drink liquids after midnight. No gum chewing or hard candies.                               __x__ 2. No Alcohol for 24 hours before or after surgery.   __x__3. No Smoking for 24 prior to surgery.   ____  4. Bring all medications with you on the day of surgery if instructed.    __x__ 5. Notify your doctor if there is any change in your medical condition     (cold, fever, infections).     Do not wear jewelry, make-up, hairpins, clips or nail polish.  Do not wear lotions, powders, or perfumes. You may wear deodorant.  Do not shave 48 hours prior to surgery. Men may shave face and neck.  Do not bring valuables to the hospital.    [facility] is not responsible for any belongings or valuables.               Contacts, dentures or bridgework may not be worn into surgery.  Leave your suitcase in the car. After surgery it may be brought to your room.  For patients admitted to the hospital, discharge time is determined by your  treatment team                       Patients discharged the day of surgery will not be allowed to drive home.  You will need someone to drive you home and stay with you the night of your procedure.    Please read over the following fact sheets that you were given:   [facility] Preparing for Surgery and or MRSA Information   ___ Take anti-hypertensive (unless it includes a diuretic), cardiac, seizure, asthma,      anti-reflux and psychiatric medicines. These include:  1.   2.  3.  4.  5.  6.  ____Fleets enema or Magnesium Citrate as directed.   _x___ Use CHG Soap or sage wipes as directed on instruction sheet   _x__ Use inhalers on the day of surgery and bring to hospital day of surgery (Use Albuterol inhaler the morning of surgery and bring to hospital.)  ____ Stop Metformin and Janumet 2 days prior to surgery.    ____ Take 1/2 of usual insulin dose the night before surgery and none on the morning surgery      _x___ Follow recommendations from Cardiologist, Pulmonologist or PCP regarding          stopping Aspirin, Coumadin, Pllavix ,Eliquis, Effient, or Pradaxa, and Pletal.  X____Stop Anti-inflammatories such as Advil, Aleve, Ibuprofen, Motrin, Naproxen, Naprosyn, Goodies powders or aspirin products. OK to take Tylenol    _x___ Stop supplements until after surgery.  But may continue Vitamin D, Vitamin B,and multivitamin         ____ Bring C-Pap to the hospital.

## 2017-02-06 ENCOUNTER — Observation Stay
Admission: RE | Admit: 2017-02-06 | Discharge: 2017-02-08 | Disposition: A | Discharge status: Home / Self Care | Payer: Commercial Managed Care - HMO | Source: Ambulatory Visit | Attending: Obstetrics & Gynecology | Admitting: Obstetrics & Gynecology

## 2017-02-06 ENCOUNTER — Encounter: Payer: Self-pay | Admitting: Anesthesiology

## 2017-02-06 ENCOUNTER — Inpatient Hospital Stay: Payer: Commercial Managed Care - HMO | Admitting: Anesthesiology

## 2017-02-06 ENCOUNTER — Encounter
Admission: RE | Disposition: A | Discharge status: Home / Self Care | Payer: Self-pay | Source: Ambulatory Visit | Attending: Obstetrics & Gynecology

## 2017-02-06 DIAGNOSIS — D509 Iron deficiency anemia, unspecified: Secondary | ICD-10-CM | POA: Diagnosis present

## 2017-02-06 DIAGNOSIS — Z833 Family history of diabetes mellitus: Secondary | ICD-10-CM | POA: Insufficient documentation

## 2017-02-06 DIAGNOSIS — R51 Headache: Secondary | ICD-10-CM | POA: Diagnosis not present

## 2017-02-06 DIAGNOSIS — D649 Anemia, unspecified: Secondary | ICD-10-CM | POA: Diagnosis not present

## 2017-02-06 DIAGNOSIS — Z8269 Family history of other diseases of the musculoskeletal system and connective tissue: Secondary | ICD-10-CM | POA: Insufficient documentation

## 2017-02-06 DIAGNOSIS — J45909 Unspecified asthma, uncomplicated: Secondary | ICD-10-CM | POA: Diagnosis not present

## 2017-02-06 DIAGNOSIS — D259 Leiomyoma of uterus, unspecified: Secondary | ICD-10-CM

## 2017-02-06 DIAGNOSIS — D252 Subserosal leiomyoma of uterus: Secondary | ICD-10-CM

## 2017-02-06 DIAGNOSIS — D251 Intramural leiomyoma of uterus: Secondary | ICD-10-CM

## 2017-02-06 DIAGNOSIS — Z79899 Other long term (current) drug therapy: Secondary | ICD-10-CM | POA: Diagnosis not present

## 2017-02-06 DIAGNOSIS — Z8261 Family history of arthritis: Secondary | ICD-10-CM | POA: Insufficient documentation

## 2017-02-06 DIAGNOSIS — N920 Excessive and frequent menstruation with regular cycle: Secondary | ICD-10-CM

## 2017-02-06 DIAGNOSIS — Z91018 Allergy to other foods: Secondary | ICD-10-CM | POA: Insufficient documentation

## 2017-02-06 DIAGNOSIS — D250 Submucous leiomyoma of uterus: Secondary | ICD-10-CM | POA: Diagnosis present

## 2017-02-06 HISTORY — DX: Leiomyoma of uterus, unspecified: D25.9

## 2017-02-06 HISTORY — PX: MYOMECTOMY: SHX85

## 2017-02-06 HISTORY — PX: LAPAROTOMY: SHX154

## 2017-02-06 HISTORY — DX: Excessive and frequent menstruation with regular cycle: N92.0

## 2017-02-06 LAB — CBC WITH DIFFERENTIAL/PLATELET
BASOS PCT: 0 %
Basophils Absolute: 0 10*3/uL (ref 0–0.1)
Basophils Absolute: 0 10*3/uL (ref 0–0.1)
Basophils Relative: 0 %
EOS ABS: 0 10*3/uL (ref 0–0.7)
EOS PCT: 0 %
Eosinophils Absolute: 0 10*3/uL (ref 0–0.7)
Eosinophils Relative: 0 %
HCT: 27.1 % — ABNORMAL LOW (ref 35.0–47.0)
HCT: 26.4 % — ABNORMAL LOW (ref 35.0–47.0)
Hemoglobin: 9 g/dL — ABNORMAL LOW (ref 12.0–16.0)
Hemoglobin: 8.7 g/dL — ABNORMAL LOW (ref 12.0–16.0)
LYMPHS ABS: 0.4 10*3/uL — AB (ref 1.0–3.6)
LYMPHS ABS: 0.1 10*3/uL — AB (ref 1.0–3.6)
LYMPHS PCT: 1 %
Lymphocytes Relative: 3 %
MCH: 24.7 pg — AB (ref 26.0–34.0)
MCH: 24.5 pg — AB (ref 26.0–34.0)
MCHC: 33.2 g/dL (ref 32.0–36.0)
MCHC: 32.8 g/dL (ref 32.0–36.0)
MCV: 74.3 fL — ABNORMAL LOW (ref 80.0–100.0)
MCV: 74.8 fL — AB (ref 80.0–100.0)
MONO ABS: 0.4 10*3/uL (ref 0.2–0.9)
MONO ABS: 0.8 10*3/uL (ref 0.2–0.9)
MONOS PCT: 5 %
Monocytes Relative: 3 %
NEUTROS ABS: 14 10*3/uL — AB (ref 1.4–6.5)
Neutro Abs: 15.9 10*3/uL — ABNORMAL HIGH (ref 1.4–6.5)
Neutrophils Relative %: 94 %
Neutrophils Relative %: 94 %
Platelets: 346 10*3/uL (ref 150–440)
Platelets: 285 10*3/uL (ref 150–440)
RBC: 3.65 MIL/uL — ABNORMAL LOW (ref 3.80–5.20)
RBC: 3.53 MIL/uL — ABNORMAL LOW (ref 3.80–5.20)
RDW: 18.3 % — AB (ref 11.5–14.5)
RDW: 18.3 % — ABNORMAL HIGH (ref 11.5–14.5)
WBC: 16.8 10*3/uL — ABNORMAL HIGH (ref 3.6–11.0)
WBC: 14.9 10*3/uL — ABNORMAL HIGH (ref 3.6–11.0)

## 2017-02-06 LAB — POCT PREGNANCY, URINE: Preg Test, Ur: NEGATIVE

## 2017-02-06 LAB — ABO/RH: ABO/RH(D): AB POS

## 2017-02-06 SURGERY — LAPAROTOMY
Anesthesia: General

## 2017-02-06 MED ORDER — ALBUTEROL SULFATE (2.5 MG/3ML) 0.083% IN NEBU: 2.5000 mg | INHALATION_SOLUTION | Freq: Four times a day (QID) | RESPIRATORY_TRACT | Status: DC | PRN

## 2017-02-06 MED ORDER — ONDANSETRON HCL 4 MG PO TABS: 4.0000 mg | ORAL_TABLET | Freq: Four times a day (QID) | ORAL | Status: DC | PRN

## 2017-02-06 MED ORDER — PHENYLEPHRINE HCL 10 MG/ML IJ SOLN
INTRAMUSCULAR | Status: AC
  Filled 2017-02-06: qty 1

## 2017-02-06 MED ORDER — SODIUM CHLORIDE 0.9 % IV SOLN
INTRAVENOUS | Status: DC | PRN
  Administered 2017-02-06: 26 mL

## 2017-02-06 MED ORDER — SODIUM CHLORIDE 0.9 % IJ SOLN
INTRAMUSCULAR | Status: AC
  Filled 2017-02-06: qty 50

## 2017-02-06 MED ORDER — ONDANSETRON HCL 4 MG/2ML IJ SOLN
INTRAMUSCULAR | Status: AC
  Administered 2017-02-06: 4 mg via INTRAVENOUS
  Filled 2017-02-06: qty 2

## 2017-02-06 MED ORDER — CEFOXITIN SODIUM-DEXTROSE 2-2.2 GM-% IV SOLR (PREMIX)
INTRAVENOUS | Status: AC
  Administered 2017-02-06: 2000 mg via INTRAVENOUS
  Filled 2017-02-06: qty 50

## 2017-02-06 MED ORDER — ACETAMINOPHEN 10 MG/ML IV SOLN
INTRAVENOUS | Status: DC | PRN
Start: 2017-02-06 — End: 2017-02-06
  Administered 2017-02-06: 1000 mg via INTRAVENOUS

## 2017-02-06 MED ORDER — OXYCODONE-ACETAMINOPHEN 5-325 MG PO TABS
1.0000 | ORAL_TABLET | ORAL | Status: DC | PRN
  Administered 2017-02-07 – 2017-02-08 (×4): 1 via ORAL
  Filled 2017-02-06 (×4): qty 1

## 2017-02-06 MED ORDER — PHENYLEPHRINE HCL 10 MG/ML IJ SOLN
INTRAMUSCULAR | Status: DC | PRN
  Administered 2017-02-06 (×2): 100 ug via INTRAVENOUS
  Administered 2017-02-06: 150 ug via INTRAVENOUS
  Administered 2017-02-06 (×4): 100 ug via INTRAVENOUS
  Administered 2017-02-06: 50 ug via INTRAVENOUS

## 2017-02-06 MED ORDER — SPIRONOLACTONE 25 MG PO TABS
50.0000 mg | ORAL_TABLET | Freq: Every evening | ORAL | Status: DC
  Administered 2017-02-07: 50 mg via ORAL
  Filled 2017-02-06 (×2): qty 1

## 2017-02-06 MED ORDER — BUPIVACAINE HCL (PF) 0.5 % IJ SOLN
INTRAMUSCULAR | Status: AC
  Filled 2017-02-06: qty 30

## 2017-02-06 MED ORDER — PROPOFOL 10 MG/ML IV BOLUS
INTRAVENOUS | Status: DC | PRN
  Administered 2017-02-06: 160 mg via INTRAVENOUS

## 2017-02-06 MED ORDER — LIDOCAINE HCL (CARDIAC) 20 MG/ML IV SOLN
INTRAVENOUS | Status: DC | PRN
  Administered 2017-02-06: 100 mg via INTRAVENOUS

## 2017-02-06 MED ORDER — FENTANYL CITRATE (PF) 100 MCG/2ML IJ SOLN
25.0000 ug | INTRAMUSCULAR | Status: DC | PRN
  Administered 2017-02-06 (×4): 25 ug via INTRAVENOUS

## 2017-02-06 MED ORDER — VASOPRESSIN 20 UNIT/ML IV SOLN
INTRAVENOUS | Status: AC
  Filled 2017-02-06: qty 1

## 2017-02-06 MED ORDER — ROCURONIUM BROMIDE 100 MG/10ML IV SOLN
INTRAVENOUS | Status: DC | PRN
  Administered 2017-02-06: 10 mg via INTRAVENOUS
  Administered 2017-02-06: 40 mg via INTRAVENOUS

## 2017-02-06 MED ORDER — LACTATED RINGERS IV SOLN
INTRAVENOUS | Status: DC
  Administered 2017-02-06 – 2017-02-07 (×5): via INTRAVENOUS

## 2017-02-06 MED ORDER — ROCURONIUM BROMIDE 50 MG/5ML IV SOLN
INTRAVENOUS | Status: AC
  Filled 2017-02-06: qty 1

## 2017-02-06 MED ORDER — SCOPOLAMINE 1 MG/3DAYS TD PT72
1.0000 | MEDICATED_PATCH | Freq: Once | TRANSDERMAL | Status: DC
  Administered 2017-02-06: 1.5 mg via TRANSDERMAL

## 2017-02-06 MED ORDER — ONDANSETRON HCL 4 MG/2ML IJ SOLN
INTRAMUSCULAR | Status: DC | PRN
Start: 2017-02-06 — End: 2017-02-06
  Administered 2017-02-06: 4 mg via INTRAVENOUS

## 2017-02-06 MED ORDER — MIDAZOLAM HCL 2 MG/2ML IJ SOLN
INTRAMUSCULAR | Status: DC | PRN
  Administered 2017-02-06: 2 mg via INTRAVENOUS

## 2017-02-06 MED ORDER — ONDANSETRON HCL 4 MG/2ML IJ SOLN
INTRAMUSCULAR | Status: AC
  Filled 2017-02-06: qty 2

## 2017-02-06 MED ORDER — CEFOXITIN SODIUM-DEXTROSE 2-2.2 GM-% IV SOLR (PREMIX)
2.0000 g | Freq: Once | INTRAVENOUS | Status: AC
  Administered 2017-02-06: 2000 mg via INTRAVENOUS

## 2017-02-06 MED ORDER — SIMETHICONE 80 MG PO CHEW
80.0000 mg | CHEWABLE_TABLET | Freq: Four times a day (QID) | ORAL | Status: DC | PRN
  Administered 2017-02-07: 80 mg via ORAL
  Filled 2017-02-06: qty 1

## 2017-02-06 MED ORDER — MORPHINE SULFATE (PF) 2 MG/ML IV SOLN
1.0000 mg | INTRAVENOUS | Status: DC | PRN
Start: 2017-02-06 — End: 2017-02-08
  Administered 2017-02-06 (×3): 1 mg via INTRAVENOUS
  Administered 2017-02-07: 2 mg via INTRAVENOUS
  Administered 2017-02-07: 1 mg via INTRAVENOUS
  Filled 2017-02-06 (×5): qty 1

## 2017-02-06 MED ORDER — ACETAMINOPHEN NICU IV SYRINGE 10 MG/ML
INTRAVENOUS | Status: AC
  Filled 2017-02-06: qty 1

## 2017-02-06 MED ORDER — BUPIVACAINE 0.25 % ON-Q PUMP DUAL CATH 400 ML
400.0000 mL | INJECTION | Status: DC
  Filled 2017-02-06: qty 400

## 2017-02-06 MED ORDER — LIDOCAINE HCL (PF) 2 % IJ SOLN
INTRAMUSCULAR | Status: AC
  Filled 2017-02-06: qty 2

## 2017-02-06 MED ORDER — SUGAMMADEX SODIUM 200 MG/2ML IV SOLN
INTRAVENOUS | Status: AC
  Filled 2017-02-06: qty 2

## 2017-02-06 MED ORDER — FAMOTIDINE 20 MG PO TABS
20.0000 mg | ORAL_TABLET | Freq: Once | ORAL | Status: AC
  Administered 2017-02-06: 20 mg via ORAL

## 2017-02-06 MED ORDER — SENNOSIDES-DOCUSATE SODIUM 8.6-50 MG PO TABS: 1.0000 | ORAL_TABLET | Freq: Every evening | ORAL | Status: DC | PRN

## 2017-02-06 MED ORDER — FENTANYL CITRATE (PF) 100 MCG/2ML IJ SOLN
INTRAMUSCULAR | Status: AC
  Administered 2017-02-06: 25 ug via INTRAVENOUS
  Filled 2017-02-06: qty 2

## 2017-02-06 MED ORDER — FENTANYL CITRATE (PF) 100 MCG/2ML IJ SOLN
INTRAMUSCULAR | Status: DC | PRN
  Administered 2017-02-06: 50 ug via INTRAVENOUS
  Administered 2017-02-06: 100 ug via INTRAVENOUS

## 2017-02-06 MED ORDER — LACTATED RINGERS IV SOLN
INTRAVENOUS | Status: DC
  Administered 2017-02-06 (×3): via INTRAVENOUS

## 2017-02-06 MED ORDER — MIDAZOLAM HCL 2 MG/2ML IJ SOLN
INTRAMUSCULAR | Status: AC
  Filled 2017-02-06: qty 2

## 2017-02-06 MED ORDER — SUGAMMADEX SODIUM 500 MG/5ML IV SOLN
INTRAVENOUS | Status: DC | PRN
  Administered 2017-02-06: 200 mg via INTRAVENOUS

## 2017-02-06 MED ORDER — IBUPROFEN 400 MG PO TABS
400.0000 mg | ORAL_TABLET | Freq: Four times a day (QID) | ORAL | Status: DC | PRN
  Administered 2017-02-07: 400 mg via ORAL
  Filled 2017-02-06: qty 1

## 2017-02-06 MED ORDER — HYDROMORPHONE HCL 1 MG/ML IJ SOLN
INTRAMUSCULAR | Status: AC
  Filled 2017-02-06: qty 1

## 2017-02-06 MED ORDER — PROPOFOL 10 MG/ML IV BOLUS
INTRAVENOUS | Status: AC
  Filled 2017-02-06: qty 20

## 2017-02-06 MED ORDER — HYDROMORPHONE HCL 1 MG/ML IJ SOLN
INTRAMUSCULAR | Status: DC | PRN
  Administered 2017-02-06: 0.5 mg via INTRAVENOUS

## 2017-02-06 MED ORDER — SCOPOLAMINE 1 MG/3DAYS TD PT72
MEDICATED_PATCH | TRANSDERMAL | Status: AC
  Administered 2017-02-06: 1.5 mg via TRANSDERMAL
  Filled 2017-02-06: qty 1

## 2017-02-06 MED ORDER — FENTANYL CITRATE (PF) 250 MCG/5ML IJ SOLN
INTRAMUSCULAR | Status: AC
  Filled 2017-02-06: qty 5

## 2017-02-06 MED ORDER — SUCCINYLCHOLINE CHLORIDE 20 MG/ML IJ SOLN
INTRAMUSCULAR | Status: AC
  Filled 2017-02-06: qty 1

## 2017-02-06 MED ORDER — FAMOTIDINE 20 MG PO TABS
ORAL_TABLET | ORAL | Status: AC
  Administered 2017-02-06: 20 mg via ORAL
  Filled 2017-02-06: qty 1

## 2017-02-06 MED ORDER — SEVOFLURANE IN SOLN
RESPIRATORY_TRACT | Status: AC
  Filled 2017-02-06: qty 250

## 2017-02-06 MED ORDER — DOCUSATE SODIUM 100 MG PO CAPS
100.0000 mg | ORAL_CAPSULE | Freq: Two times a day (BID) | ORAL | Status: DC
  Administered 2017-02-06 – 2017-02-08 (×4): 100 mg via ORAL
  Filled 2017-02-06 (×4): qty 1

## 2017-02-06 MED ORDER — ACETAMINOPHEN 325 MG PO TABS: 650.0000 mg | ORAL_TABLET | ORAL | Status: DC | PRN

## 2017-02-06 MED ORDER — ONDANSETRON HCL 4 MG/2ML IJ SOLN: 4.0000 mg | Freq: Four times a day (QID) | INTRAMUSCULAR | Status: DC | PRN

## 2017-02-06 MED ORDER — DEXAMETHASONE SODIUM PHOSPHATE 10 MG/ML IJ SOLN
INTRAMUSCULAR | Status: AC
  Filled 2017-02-06: qty 1

## 2017-02-06 MED ORDER — VASOPRESSIN 20 UNIT/ML IJ SOLN
INTRAMUSCULAR | Status: DC | PRN
  Administered 2017-02-06: 10 [IU] via SUBCUTANEOUS

## 2017-02-06 MED ORDER — BISACODYL 10 MG RE SUPP: 10.0000 mg | Freq: Every day | RECTAL | Status: DC | PRN

## 2017-02-06 MED ORDER — ONDANSETRON HCL 4 MG/2ML IJ SOLN
4.0000 mg | Freq: Once | INTRAMUSCULAR | Status: AC | PRN
  Administered 2017-02-06: 4 mg via INTRAVENOUS

## 2017-02-06 MED ORDER — DEXMEDETOMIDINE HCL 200 MCG/2ML IV SOLN
INTRAVENOUS | Status: DC | PRN
  Administered 2017-02-06: 8 ug via INTRAVENOUS

## 2017-02-06 MED ORDER — DEXAMETHASONE SODIUM PHOSPHATE 10 MG/ML IJ SOLN
INTRAMUSCULAR | Status: DC | PRN
  Administered 2017-02-06: 10 mg via INTRAVENOUS

## 2017-02-06 SURGICAL SUPPLY — 51 items
BAG COUNTER SPONGE EZ (MISCELLANEOUS) ×2 IMPLANT
BARRIER ADHS 3X4 INTERCEED (GAUZE/BANDAGES/DRESSINGS) ×3 IMPLANT
CANISTER SUCT 1200ML W/VALVE (MISCELLANEOUS) ×3 IMPLANT
CATH FOL LEG HOLDER (MISCELLANEOUS) ×3 IMPLANT
CATH KIT ON-Q SILVERSOAK 5IN (CATHETERS) ×6 IMPLANT
CATH TRAY 16F METER LATEX (MISCELLANEOUS) ×3 IMPLANT
CHLORAPREP W/TINT 26ML (MISCELLANEOUS) ×3 IMPLANT
CLOSURE WOUND 1/2 X4 (GAUZE/BANDAGES/DRESSINGS) ×1
COUNTER NEEDLE 20/40 LG (NEEDLE) ×3 IMPLANT
COUNTER SPONGE BAG EZ (MISCELLANEOUS) ×1
DERMABOND ADVANCED (GAUZE/BANDAGES/DRESSINGS) ×4
DERMABOND ADVANCED .7 DNX12 (GAUZE/BANDAGES/DRESSINGS) ×2 IMPLANT
DRAPE LAPAROTOMY 100X77 ABD (DRAPES) ×3 IMPLANT
DRAPE LAPAROTOMY TRNSV 106X77 (MISCELLANEOUS) ×3 IMPLANT
DRSG TELFA 3X8 NADH (GAUZE/BANDAGES/DRESSINGS) ×3 IMPLANT
ELECT BLADE 6 FLAT ULTRCLN (ELECTRODE) ×3 IMPLANT
ELECT BLADE 6.5 EXT (BLADE) ×3 IMPLANT
ELECT CAUTERY BLADE 6.4 (BLADE) ×3 IMPLANT
ELECT REM PT RETURN 9FT ADLT (ELECTROSURGICAL) ×3
ELECTRODE REM PT RTRN 9FT ADLT (ELECTROSURGICAL) ×1 IMPLANT
GAUZE SPONGE 4X4 12PLY STRL (GAUZE/BANDAGES/DRESSINGS) ×3 IMPLANT
GLOVE BIO SURGEON STRL SZ8 (GLOVE) ×3 IMPLANT
GOWN STRL REUS W/ TWL LRG LVL3 (GOWN DISPOSABLE) ×1 IMPLANT
GOWN STRL REUS W/ TWL XL LVL3 (GOWN DISPOSABLE) ×1 IMPLANT
GOWN STRL REUS W/TWL LRG LVL3 (GOWN DISPOSABLE) ×2
GOWN STRL REUS W/TWL XL LVL3 (GOWN DISPOSABLE) ×2
KIT RM TURNOVER STRD PROC AR (KITS) ×3 IMPLANT
LABEL OR SOLS (LABEL) ×3 IMPLANT
NS IRRIG 1000ML POUR BTL (IV SOLUTION) ×3 IMPLANT
PACK BASIN MAJOR ARMC (MISCELLANEOUS) ×3 IMPLANT
PAD OB MATERNITY 4.3X12.25 (PERSONAL CARE ITEMS) ×3 IMPLANT
SPONGE LAP 18X18 5 PK (GAUZE/BANDAGES/DRESSINGS) ×12 IMPLANT
STAPLER SKIN PROX 35W (STAPLE) ×3 IMPLANT
STRAP SAFETY BODY (MISCELLANEOUS) ×3 IMPLANT
STRIP CLOSURE SKIN 1/2X4 (GAUZE/BANDAGES/DRESSINGS) ×2 IMPLANT
SUT ETHIBOND 0 (SUTURE) IMPLANT
SUT ETHIBOND CT1 BRD #0 30IN (SUTURE) ×3 IMPLANT
SUT MAXON ABS #0 GS21 30IN (SUTURE) IMPLANT
SUT VIC AB 0 CT1 27 (SUTURE) ×6
SUT VIC AB 0 CT1 27XCR 8 STRN (SUTURE) ×3 IMPLANT
SUT VIC AB 0 CT1 36 (SUTURE) IMPLANT
SUT VIC AB 1 CT1 36 (SUTURE) ×3 IMPLANT
SUT VIC AB 2-0 SH 27 (SUTURE) ×12
SUT VIC AB 2-0 SH 27XBRD (SUTURE) ×6 IMPLANT
SUT VIC AB 4-0 PS2 18 (SUTURE) ×3 IMPLANT
SUT VICRYL PLUS ABS 0 54 (SUTURE) IMPLANT
SYR 30ML LL (SYRINGE) ×3 IMPLANT
SYR CONTROL 10ML (SYRINGE) ×3 IMPLANT
SYRINGE 10CC LL (SYRINGE) ×3 IMPLANT
TOWEL BLUE STERILE X RAY DET (MISCELLANEOUS) ×3 IMPLANT
TRAY PREP VAG/GEN (MISCELLANEOUS) ×3 IMPLANT

## 2017-02-06 NOTE — Anesthesia Postprocedure Evaluation (Signed)
Anesthesia Post Note  Patient: Michelle Hardy  Procedure(s) Performed: Procedure(s) (LRB): LAPAROTOMY (N/A) MYOMECTOMY (N/A)  Patient location during evaluation: PACU Anesthesia Type: General Level of consciousness: awake and alert Pain management: pain level controlled Vital Signs Assessment: post-procedure vital signs reviewed and stable Respiratory status: spontaneous breathing, nonlabored ventilation, respiratory function stable and patient connected to nasal cannula oxygen Cardiovascular status: blood pressure returned to baseline and stable Postop Assessment: no signs of nausea or vomiting Anesthetic complications: no     Last Vitals:  Vitals:   02/06/17 1326 02/06/17 1331  BP:    Pulse: 97 94  Resp: 19 18  Temp:      Last Pain:  Vitals:   02/06/17 1331  TempSrc:   PainSc: 6                  Molli Barrows

## 2017-02-06 NOTE — H&P (Signed)
History and Physical Interval Note:  02/06/2017 9:03 AM  Michelle Hardy  has presented today for surgery, with the diagnosis of FIBROID UTERUS  The various methods of treatment have been discussed with the patient and family. After consideration of risks, benefits and other options for treatment, the patient has consented to  Procedure(s): LAPAROTOMY (N/A) MYOMECTOMY (N/A) as a surgical intervention .  The patient's history has been reviewed, patient examined, no change in status, stable for surgery.  Pt has the following beta blocker history-  Not taking Beta Blocker.  I have reviewed the patient's chart and labs.  Questions were answered to the patient's satisfaction.    Hoyt Koch

## 2017-02-06 NOTE — Transfer of Care (Signed)
Immediate Anesthesia Transfer of Care Note  Patient: Michelle Hardy  Procedure(s) Performed: Procedure(s): LAPAROTOMY (N/A) MYOMECTOMY (N/A)  Patient Location: PACU  Anesthesia Type:General  Level of Consciousness: sedated  Airway & Oxygen Therapy: Patient Spontanous Breathing and Patient connected to face mask  Post-op Assessment: Report given to RN and Post -op Vital signs reviewed and stable  Post vital signs: Reviewed and stable  Last Vitals:  Vitals:   02/06/17 0838 02/06/17 1251  BP: 132/80 120/71  Pulse: 88 94  Resp: 16 18  Temp: (!) 36 C 36.3 C    Last Pain:  Vitals:   02/06/17 0838  TempSrc: Tympanic         Complications: No apparent anesthesia complications

## 2017-02-06 NOTE — Anesthesia Procedure Notes (Signed)
Procedure Name: Intubation Date/Time: 02/06/2017 9:55 AM Performed by: Justus Memory Pre-anesthesia Checklist: Patient identified, Emergency Drugs available, Suction available and Patient being monitored Patient Re-evaluated:Patient Re-evaluated prior to inductionOxygen Delivery Method: Circle system utilized Preoxygenation: Pre-oxygenation with 100% oxygen Intubation Type: IV induction Ventilation: Mask ventilation without difficulty Laryngoscope Size: Mac and 3 Grade View: Grade I Tube type: Oral Tube size: 7.0 mm Number of attempts: 1 Airway Equipment and Method: Stylet Secured at: 21 cm Tube secured with: Tape Dental Injury: Teeth and Oropharynx as per pre-operative assessment

## 2017-02-06 NOTE — Op Note (Addendum)
Operative Note   02/06/2017 12:41 PM  PRE-OP DIAGNOSIS: FIBROID UTERUS   POST-OP DIAGNOSIS: same   PROCEDURE: Laparotomy, Open Multiple Myomectomy   SURGEON: Barnett Applebaum, MD, FACOG  ASSISTANT: Dr Glennon Mac  ANESTHESIA: Choice   ESTIMATED BLOOD LOSS: 9924 mL   COMPLICATIONS: None  VTE PROPHYLAXIS: SCDs to the bilateral lower extremities  ANTIBIOTICS: Two grams of Cefazolin were given.  CONDITION: stable  INDICATIONS: Large multiglobular fibroid uterus with menorrhagia and anemia  DISPOSITION: PACU - hemodynamically stable.  FINDINGS: Examination under anesthesia revealed an approximately 16 week size, mobile globular uterus. Laparotomy findings included uterine fibroids with largest approximately 6cm. Approximately 30 fibroids were removed.  Normal ovaries.  PROCEDURE IN DETAIL:  The patient was taken to the OR where her general anesthesia was administered. She was prepped and draped in the usual sterile fashion in the supine position. A Foley catheter was sterilely placed. Attention was turned to the patient's abdomen, where a lower transverse incision was made with the scalpel through a prior scar and carried to the underlying layer of fascia. The fascia was incised in the midline and this incision was extended with Mayo scissors. Next, the peritoneum was identified and grasped with Kelly clamps and entered sharply with Metzenbaum scissors. This incision was extended superiorly and inferiorly with the bovie with good visualization of the bladder. A survey of the patient's abdomen was performed with the above noted findings.   Next, the uterus was lifted out of the pelvis and the bowel packed with moist laparotomy sponges. The uterine serosa was injected with approximately 26 mL of dilute vasopressin in a verticle line over different areas of fibroids with blanching noted. A vertical incision was made through the superficial myometrium to the level of the myoma with a scalpel. The  fibroid was grasped with a towel clips and the myoma was shelled out using the bovie/metz scissors in the plane of cleavage between the myoma and myometrium. The myometrium was then reapproximated in approximately two to four layers using 0 vicryl suture. The serosa was closed using 2-0 vicryl in a baseball stitch. Excellent hemostasis was noted.  This was repeated for all the fibroids removed (30). The uterus was returned to the abdomen, with Interceed placed over the incision lines to help aid in prevention of adhesions, and the laparotomy sponges were removed and the pelvis was irrigated. Again the incision was inspected and excellent hemostasis was noted.  The On Q Pain pump System was then placed.  Trocars were placed through the abdominal wall into the subfascial space and these were used to thread the silver soaker cathaters into place.The rectus fascia was then reapproximated with running sutures of Maxon, with careful placement not to incorporate the cathaters. Subcutaneous tissues are then irrigated with saline and hemostasis assured.  Skin is then closed with 4-0 vicryl suture in a subcuticular fashion followed by skin adhesive.The cathaters are flushed each with 5 mL of Bupivicaine and stabilized into place with dressing.   The patient tolerated the procedure well. All sponge, lap and needle counts were correct x 2. The patient was taken to the recovery room in stable condition.  Barnett Applebaum, MD, [facility], [facility] 02/06/2017  12:45 PM

## 2017-02-06 NOTE — Discharge Instructions (Signed)
General Gynecological Post-Operative Instructions °You may expect to feel dizzy, weak, and drowsy for as long as 24 hours after receiving the medicine that made you sleep (anesthetic).  °Do not drive a car, ride a bicycle, participate in physical activities, or take public transportation until you are done taking narcotic pain medicines or as directed by your doctor.  °Do not drink alcohol or take tranquilizers.  °Do not take medicine that has not been prescribed by your doctor.  °Do not sign important papers or make important decisions while on narcotic pain medicines.  °Have a responsible person with you.  °CARE OF INCISION  °Keep incision clean and dry. °Take showers instead of baths until your doctor gives you permission to take baths.  °Avoid heavy lifting (more than 10 pounds/4.5 kilograms), pushing, or pulling.  °Avoid activities that may risk injury to your surgical site.  °No sexual intercourse or placement of anything in the vagina for 2 weeks or as instructed by your doctor. °If you have tubes coming from the wound site, check with your doctor regarding appropriate care of the tubes. °Only take prescription or over-the-counter medicines  for pain, discomfort, or fever as directed by your doctor. Do not take aspirin. It can make you bleed. Take medicines (antibiotics) that kill germs if they are prescribed for you.  °Call the office or go to the ER if:  °You feel sick to your stomach (nauseous) and you start to throw up (vomit).  °You have trouble eating or drinking.  °You have an oral temperature above 101.  °You have constipation that is not helped by adjusting diet or increasing fluid intake. Pain medicines are a common cause of constipation.  °You have any other concerns. °SEEK IMMEDIATE MEDICAL CARE IF:  °You have persistent dizziness.  °You have difficulty breathing or a congested sounding (croupy) cough.  °You have an oral temperature above 102.5, not controlled by medicine.  °There is increasing  pain or tenderness near or in the surgical site.  ° ° ° °

## 2017-02-06 NOTE — Anesthesia Preprocedure Evaluation (Signed)
Anesthesia Evaluation  Patient identified by MRN, date of birth, ID band Patient awake    Reviewed: Allergy & Precautions, H&P , NPO status , Patient's Chart, lab work & pertinent test results, reviewed documented beta blocker date and time   History of Anesthesia Complications (+) PONV, Family history of anesthesia reaction and history of anesthetic complications  Airway Mallampati: II  TM Distance: >3 FB Neck ROM: full    Dental  (+) Teeth Intact   Pulmonary neg pulmonary ROS, asthma ,    Pulmonary exam normal        Cardiovascular negative cardio ROS Normal cardiovascular exam Rhythm:regular Rate:Normal     Neuro/Psych  Headaches, negative neurological ROS  negative psych ROS   GI/Hepatic negative GI ROS, Neg liver ROS,   Endo/Other  negative endocrine ROS  Renal/GU negative Renal ROS  negative genitourinary   Musculoskeletal   Abdominal   Peds  Hematology negative hematology ROS (+) anemia ,   Anesthesia Other Findings Past Medical History: No date: Anemia No date: Asthma No date: Family history of adverse reaction to anesthes*     Comment: mother has problems with blood pressure and               difficulty staying asleep during surgery No date: Headache No date: History of uterine fibroid No date: PONV (postoperative nausea and vomiting) Past Surgical History: 2011: MYOMECTOMY BMI    Body Mass Index:  24.28 kg/m     Reproductive/Obstetrics negative OB ROS                             Anesthesia Physical Anesthesia Plan  ASA: II  Anesthesia Plan: General ETT   Post-op Pain Management:    Induction:   Airway Management Planned:   Additional Equipment:   Intra-op Plan:   Post-operative Plan:   Informed Consent: I have reviewed the patients History and Physical, chart, labs and discussed the procedure including the risks, benefits and alternatives for the  proposed anesthesia with the patient or authorized representative who has indicated his/her understanding and acceptance.   Dental Advisory Given  Plan Discussed with: CRNA  Anesthesia Plan Comments:         Anesthesia Quick Evaluation

## 2017-02-06 NOTE — Progress Notes (Signed)
Day of Surgery Procedure(s) (LRB): LAPAROTOMY (N/A) MYOMECTOMY (N/A)  Subjective: Patient reports drowsiness still from anesthesia and lower abd soreness..    Objective: I have reviewed patient's vital signs, intake and output and medications.  Abd: Min T, ND Incision: clean, dry and intact Extr: no calf T, no edema  Assessment: s/p Procedure(s): LAPAROTOMY (N/A) MYOMECTOMY (N/A): stable  Plan: Clear liquids pain meds for now; adviance diet later; foley out in am and then ambulate; f/u blood counts at 1700 and 0500  LOS: 1 day    Michelle Hardy 02/06/2017, 4:05 PM

## 2017-02-06 NOTE — OR Nursing (Signed)
Iv in right hand inflitated. New IV start in left arm

## 2017-02-06 NOTE — Anesthesia Post-op Follow-up Note (Cosign Needed)
Anesthesia QCDR form completed.        

## 2017-02-07 ENCOUNTER — Encounter: Payer: Self-pay | Admitting: Obstetrics & Gynecology

## 2017-02-07 DIAGNOSIS — D259 Leiomyoma of uterus, unspecified: Secondary | ICD-10-CM | POA: Diagnosis not present

## 2017-02-07 LAB — HEMOGLOBIN
HEMOGLOBIN: 7.7 g/dL — AB (ref 12.0–16.0)
Hemoglobin: 7.7 g/dL — ABNORMAL LOW (ref 12.0–16.0)

## 2017-02-07 LAB — SURGICAL PATHOLOGY

## 2017-02-07 MED ORDER — FERROUS SULFATE 325 (65 FE) MG PO TABS
325.0000 mg | ORAL_TABLET | Freq: Two times a day (BID) | ORAL | Status: DC
  Administered 2017-02-07 – 2017-02-08 (×2): 325 mg via ORAL
  Filled 2017-02-07 (×2): qty 1

## 2017-02-07 NOTE — Progress Notes (Signed)
1 Day Post-Op Procedure(s) (LRB): LAPAROTOMY (N/A) MYOMECTOMY (N/A)  Subjective: Patient reports tolerating PO and some pain lower abdomen; no bloating or sharp shooting pains.    Objective: I have reviewed patient's vital signs, intake and output, medications and labs.  Abd: Min T, ND Incision: clean, dry and intact Extr: no calf T, no edema  Assessment: s/p Procedure(s): LAPAROTOMY (N/A) MYOMECTOMY (N/A): stable  Plan: Advance diet Encourage ambulation Advance to PO medication  Monitor anemia.  No signs of continued blood loss, just normalizing from surgery.   LOS: 1 day  Michelle Hardy 02/07/2017, 9:31 AM

## 2017-02-08 DIAGNOSIS — D259 Leiomyoma of uterus, unspecified: Secondary | ICD-10-CM | POA: Diagnosis not present

## 2017-02-08 MED ORDER — OXYCODONE-ACETAMINOPHEN 5-325 MG PO TABS: 1.0000 | ORAL_TABLET | ORAL | 0 refills | Status: DC | PRN

## 2017-02-08 NOTE — Discharge Summary (Signed)
Gynecology Physician Postoperative Discharge Summary  Patient ID: Michelle Hardy MRN: [id number] DOB/AGE: 06/14/1981 36 y.o.  Admit Date: 02/06/2017 Discharge Date: 02/08/2017  Preoperative Diagnoses: Fibroid Uterus  Procedures: Procedure(s) (LRB): LAPAROTOMY (N/A) MYOMECTOMY (N/A)  Significant Labs: CBC Latest Ref Rng & Units 02/07/2017 02/07/2017 02/06/2017  WBC 3.6 - 11.0 K/uL - - 14.9(H)  Hemoglobin 12.0 - 16.0 g/dL 7.7(L) 7.7(L) 8.7(L)  Hematocrit 35.0 - 47.0 % - - 26.4(L)  Platelets 150 - 440 K/uL - - 285    Hospital Course:  Michelle Hardy is a 36 y.o. G0P0000  admitted for scheduled surgery.  She underwent the procedures as mentioned above, her operation was uncomplicated. For further details about surgery, please refer to the operative report. Patient had an uncomplicated postoperative course. By time of discharge on POD#2, her pain was controlled on oral pain medications; she was ambulating, voiding without difficulty, tolerating regular diet and passing flatus. She was deemed stable for discharge to home.   Discharge Exam: Blood pressure (!) 105/59, pulse (!) 108, temperature 99 F (37.2 C), resp. rate 18, height 5\' 7"  (1.702 m), weight 155 lb (70.3 kg), last menstrual period 01/13/2017, SpO2 100 %. General appearance: alert and no distress  Resp: clear to auscultation bilaterally  Cardio: regular rate and rhythm  GI: soft, non-tender; bowel sounds normal; no masses, no organomegaly.  Incision: C/D/I, no erythema, no drainage noted Pelvic: scant blood on pad  Extremities: extremities normal, atraumatic, no cyanosis or edema and Homans sign is negative, no sign of DVT  Discharged Condition: Stable  Disposition: Final discharge disposition not confirmed  Discharge Instructions    Call MD for:  redness, tenderness, or signs of infection (pain, swelling, redness, odor or green/yellow discharge around incision site)    Complete by:  As directed    Call MD for:  severe  uncontrolled pain    Complete by:  As directed    Call MD for:  temperature >100.4    Complete by:  As directed    Diet general    Complete by:  As directed    Driving Restrictions    Complete by:  As directed    No driving for 2 weeks   Increase activity slowly    Complete by:  As directed    Lifting restrictions    Complete by:  As directed    No heavy lifting (>30lbs) for 6 weeks   No dressing needed    Complete by:  As directed    Have dressing over incision removed prior to discharge.  Keep OnQ Pain Pump in place for 96 hours (4 days) then remove at home.   Sexual Activity Restrictions    Complete by:  As directed    No sexual activity for 6 weeks     Allergies as of 02/08/2017      Reactions   Coconut Fatty Acids Itching, Rash      Medication List    TAKE these medications   albuterol 108 (90 Base) MCG/ACT inhaler Commonly known as:  PROVENTIL HFA;VENTOLIN HFA Inhale 2 puffs into the lungs every 6 (six) hours as needed for wheezing or shortness of breath.   ibuprofen 200 MG tablet Commonly known as:  ADVIL,MOTRIN Take 400 mg by mouth every 6 (six) hours as needed for mild pain.   IRON PO Take 1 tablet by mouth daily.   MULTI ADULT GUMMIES PO Take 2 tablets by mouth daily.   oxyCODONE-acetaminophen 5-325 MG tablet Commonly known as:  PERCOCET/ROXICET Take  1-2 tablets by mouth every 4 (four) hours as needed (moderate to severe pain (when tolerating fluids)).   spironolactone 50 MG tablet Commonly known as:  ALDACTONE Take 1 tablet by mouth every evening. Takes with Tri-Sprintec   TRI-SPRINTEC 0.18/0.215/0.25 MG-35 MCG tablet Generic drug:  Norgestimate-Ethinyl Estradiol Triphasic Take 1 tablet by mouth every evening. Takes with the [place], MD Follow up in 2 week(s).   Specialty:  Obstetrics and Gynecology Why:  02/21/17 at 1:50 at [facility] information: [place]  [id number] [phone]           Barnett Applebaum, MD

## 2017-02-08 NOTE — Progress Notes (Signed)
Discharge instructions complete and prescriptions given. Patient verbalizes understanding of teaching. Patient discharged home at 1050.

## 2017-02-21 ENCOUNTER — Ambulatory Visit (INDEPENDENT_AMBULATORY_CARE_PROVIDER_SITE_OTHER): Payer: 59 | Admitting: Obstetrics & Gynecology

## 2017-02-21 VITALS — BP 100/60 | HR 100 | Ht 67.0 in | Wt 151.0 lb

## 2017-02-21 DIAGNOSIS — D251 Intramural leiomyoma of uterus: Secondary | ICD-10-CM

## 2017-02-21 DIAGNOSIS — D250 Submucous leiomyoma of uterus: Secondary | ICD-10-CM

## 2017-02-21 MED ORDER — HYDROCODONE-ACETAMINOPHEN 5-325 MG PO TABS: 1.0000 | ORAL_TABLET | Freq: Four times a day (QID) | ORAL | 0 refills | Status: DC | PRN

## 2017-02-21 NOTE — Progress Notes (Signed)
  Postoperative Follow-up Patient presents post op from Myomectomy 2 weeks ago for fibroids.  Subjective: Patient reports some improvement in her preop symptoms. Eating a regular diet without difficulty. Pain is controlled with current analgesics. Medications being used: ibuprofen (OTC) and narcotic analgesics including oxycodone/acetaminophen (Percocet, Tylox).  Activity: sedentary. Patient reports vaginal sx's of None  Objective: BP 100/60   Pulse 100   Ht 5\' 7"  (1.702 m)   Wt 151 lb (68.5 kg)   BMI 23.65 kg/m  Physical Exam  Constitutional: She is oriented to person, place, and time. She appears well-developed and well-nourished. No distress.  Cardiovascular: Normal rate.   Pulmonary/Chest: Effort normal.  Abdominal: Soft. She exhibits no distension. There is no tenderness.  Incision Healing Well   Musculoskeletal: Normal range of motion.  Neurological: She is alert and oriented to person, place, and time. No cranial nerve deficit.  Skin: Skin is warm and dry.  Psychiatric: She has a normal mood and affect.    Assessment: s/p :  Open Myomectomy (30 fibroids removed). stable  Plan: Patient has done well after surgery with no apparent complications.  I have discussed the post-operative course to date, and the expected progress moving forward.  The patient understands what complications to be concerned about.  I will see the patient in routine follow up, or sooner if needed.    Activity plan: No heavy lifting. May return to work in 43 days  Hoyt Koch 02/21/2017, 2:19 PM

## 2017-03-18 ENCOUNTER — Ambulatory Visit: Payer: 59 | Admitting: Obstetrics & Gynecology

## 2017-03-19 ENCOUNTER — Ambulatory Visit (INDEPENDENT_AMBULATORY_CARE_PROVIDER_SITE_OTHER): Payer: 59 | Admitting: Obstetrics & Gynecology

## 2017-03-19 ENCOUNTER — Encounter: Payer: Self-pay | Admitting: Obstetrics & Gynecology

## 2017-03-19 VITALS — BP 120/70 | HR 108 | Ht 67.0 in | Wt 152.0 lb

## 2017-03-19 DIAGNOSIS — D250 Submucous leiomyoma of uterus: Secondary | ICD-10-CM

## 2017-03-19 DIAGNOSIS — D251 Intramural leiomyoma of uterus: Secondary | ICD-10-CM

## 2017-03-19 NOTE — Progress Notes (Signed)
  Postoperative Follow-up Patient presents post op from  Open Myomectomy (30 fibroids removed) for fibroids, 6 weeks ago.  Subjective: Patient reports marked improvement in her preop symptoms. Eating a regular diet without difficulty. The patient is not having any pain.  Activity: normal activities of daily living. Patient reports vaginal sx's of None.  Period much better this month.  Objective: BP 120/70   Pulse (!) 108   Ht 5\' 7"  (1.702 m)   Wt 152 lb (68.9 kg)   LMP 03/16/2017   BMI 23.81 kg/m  Physical Exam  Constitutional: She is oriented to person, place, and time. She appears well-developed and well-nourished. No distress.  Cardiovascular: Normal rate.   Pulmonary/Chest: Effort normal.  Abdominal: Soft. She exhibits no distension. There is no tenderness.  Incision Healing Well   Musculoskeletal: Normal range of motion.  Neurological: She is alert and oriented to person, place, and time. No cranial nerve deficit.  Skin: Skin is warm and dry.  Psychiatric: She has a normal mood and affect.    Assessment: s/p :   Open Myomectomy (30 fibroids removed) stable  Plan: Patient has done well after surgery with no apparent complications.  I have discussed the post-operative course to date, and the expected progress moving forward.  The patient understands what complications to be concerned about.  I will see the patient in routine follow up, or sooner if needed.    Activity plan: No restriction.  Hoyt Koch 03/19/2017, 2:52 PM

## 2017-09-02 ENCOUNTER — Telehealth: Payer: Self-pay

## 2017-09-02 NOTE — Telephone Encounter (Signed)
FMLA/DISABILITY for for [facility] [employer] filled out and given to [place] for processing.

## 2019-05-24 ENCOUNTER — Other Ambulatory Visit
Admission: RE | Admit: 2019-05-24 | Discharge: 2019-05-24 | Disposition: A | Discharge status: Home / Self Care | Payer: 59 | Source: Ambulatory Visit | Attending: Pediatrics | Admitting: Pediatrics

## 2019-05-24 DIAGNOSIS — R0602 Shortness of breath: Secondary | ICD-10-CM | POA: Insufficient documentation

## 2019-05-24 LAB — FIBRIN DERIVATIVES D-DIMER (ARMC ONLY): Fibrin derivatives D-dimer (ARMC): 348.87 ng/mL (FEU) (ref 0.00–499.00)

## 2021-07-19 ENCOUNTER — Other Ambulatory Visit: Payer: Self-pay

## 2021-07-19 ENCOUNTER — Inpatient Hospital Stay (HOSPITAL_COMMUNITY)
Admission: EM | Admit: 2021-07-19 | Discharge: 2021-07-23 | DRG: 815 | Disposition: A | Discharge status: Home / Self Care | Payer: No Typology Code available for payment source | Attending: General Surgery | Admitting: General Surgery

## 2021-07-19 ENCOUNTER — Emergency Department (HOSPITAL_COMMUNITY): Payer: No Typology Code available for payment source

## 2021-07-19 ENCOUNTER — Encounter (HOSPITAL_COMMUNITY): Payer: Self-pay | Admitting: Emergency Medicine

## 2021-07-19 DIAGNOSIS — S2242XA Multiple fractures of ribs, left side, initial encounter for closed fracture: Secondary | ICD-10-CM

## 2021-07-19 DIAGNOSIS — T1490XA Injury, unspecified, initial encounter: Secondary | ICD-10-CM

## 2021-07-19 DIAGNOSIS — S36039A Unspecified laceration of spleen, initial encounter: Principal | ICD-10-CM

## 2021-07-19 DIAGNOSIS — Z23 Encounter for immunization: Secondary | ICD-10-CM

## 2021-07-19 DIAGNOSIS — M25552 Pain in left hip: Secondary | ICD-10-CM | POA: Diagnosis not present

## 2021-07-19 DIAGNOSIS — S36031A Moderate laceration of spleen, initial encounter: Secondary | ICD-10-CM | POA: Diagnosis present

## 2021-07-19 DIAGNOSIS — M25511 Pain in right shoulder: Secondary | ICD-10-CM | POA: Diagnosis not present

## 2021-07-19 DIAGNOSIS — Y92410 Unspecified street and highway as the place of occurrence of the external cause: Secondary | ICD-10-CM | POA: Diagnosis not present

## 2021-07-19 DIAGNOSIS — Z20822 Contact with and (suspected) exposure to covid-19: Secondary | ICD-10-CM | POA: Diagnosis present

## 2021-07-19 DIAGNOSIS — S0181XA Laceration without foreign body of other part of head, initial encounter: Secondary | ICD-10-CM | POA: Diagnosis present

## 2021-07-19 DIAGNOSIS — M25551 Pain in right hip: Secondary | ICD-10-CM | POA: Diagnosis not present

## 2021-07-19 HISTORY — DX: Unspecified laceration of spleen, initial encounter: S36.039A

## 2021-07-19 LAB — COMPREHENSIVE METABOLIC PANEL
ALT: 21 U/L (ref 0–44)
AST: 32 U/L (ref 15–41)
Albumin: 3.7 g/dL (ref 3.5–5.0)
Alkaline Phosphatase: 52 U/L (ref 38–126)
Anion gap: 7 (ref 5–15)
BUN: 7 mg/dL (ref 6–20)
CO2: 21 mmol/L — ABNORMAL LOW (ref 22–32)
Calcium: 8.9 mg/dL (ref 8.9–10.3)
Chloride: 106 mmol/L (ref 98–111)
Creatinine, Ser: 0.83 mg/dL (ref 0.44–1.00)
GFR, Estimated: >60 mL/min (ref >60)
Glucose, Bld: 104 mg/dL — ABNORMAL HIGH (ref 70–99)
Potassium: 3.7 mmol/L (ref 3.5–5.1)
Sodium: 134 mmol/L — ABNORMAL LOW (ref 135–145)
Total Bilirubin: 0.5 mg/dL (ref 0.3–1.2)
Total Protein: 7.4 g/dL (ref 6.5–8.1)

## 2021-07-19 LAB — I-STAT CHEM 8, ED
BUN: 7 mg/dL (ref 6–20)
Calcium, Ion: 1.07 mmol/L — ABNORMAL LOW (ref 1.15–1.40)
Chloride: 107 mmol/L (ref 98–111)
Creatinine, Ser: 0.7 mg/dL (ref 0.44–1.00)
Glucose, Bld: 109 mg/dL — ABNORMAL HIGH (ref 70–99)
HCT: 30 % — ABNORMAL LOW (ref 36.0–46.0)
Hemoglobin: 10.2 g/dL — ABNORMAL LOW (ref 12.0–15.0)
Potassium: 4 mmol/L (ref 3.5–5.1)
Sodium: 136 mmol/L (ref 135–145)
TCO2: 24 mmol/L (ref 22–32)

## 2021-07-19 LAB — CBC
HCT: 32.3 % — ABNORMAL LOW (ref 36.0–46.0)
Hemoglobin: 9.6 g/dL — ABNORMAL LOW (ref 12.0–15.0)
MCH: 20.9 pg — ABNORMAL LOW (ref 26.0–34.0)
MCHC: 29.7 g/dL — ABNORMAL LOW (ref 30.0–36.0)
MCV: 70.4 fL — ABNORMAL LOW (ref 80.0–100.0)
Platelets: 323 10*3/uL (ref 150–400)
RBC: 4.59 MIL/uL (ref 3.87–5.11)
RDW: 19.7 % — ABNORMAL HIGH (ref 11.5–15.5)
WBC: 8.9 10*3/uL (ref 4.0–10.5)
nRBC: 0 % (ref 0.0–0.2)

## 2021-07-19 LAB — RESP PANEL BY RT-PCR (FLU A&B, COVID) ARPGX2
Influenza A by PCR: NEGATIVE
Influenza B by PCR: NEGATIVE
SARS Coronavirus 2 by RT PCR: NEGATIVE

## 2021-07-19 LAB — SAMPLE TO BLOOD BANK

## 2021-07-19 LAB — ETHANOL: Alcohol, Ethyl (B): <10 mg/dL (ref <10)

## 2021-07-19 LAB — PROTIME-INR
INR: 1.2 (ref 0.8–1.2)
Prothrombin Time: 14.7 seconds (ref 11.4–15.2)

## 2021-07-19 LAB — I-STAT BETA HCG BLOOD, ED (MC, WL, AP ONLY): I-stat hCG, quantitative: <5 m[IU]/mL (ref <5)

## 2021-07-19 LAB — LACTIC ACID, PLASMA: Lactic Acid, Venous: 1.9 mmol/L (ref 0.5–1.9)

## 2021-07-19 MED ORDER — ONDANSETRON HCL 4 MG/2ML IJ SOLN
4.0000 mg | Freq: Four times a day (QID) | INTRAMUSCULAR | Status: DC | PRN
  Administered 2021-07-20 (×2): 4 mg via INTRAVENOUS
  Filled 2021-07-19 (×2): qty 2

## 2021-07-19 MED ORDER — METOPROLOL TARTRATE 5 MG/5ML IV SOLN
5.0000 mg | Freq: Four times a day (QID) | INTRAVENOUS | Status: DC | PRN
Start: 2021-07-19 — End: 2021-07-23

## 2021-07-19 MED ORDER — DEXTROSE-NACL 5-0.45 % IV SOLN: INTRAVENOUS | Status: DC

## 2021-07-19 MED ORDER — ONDANSETRON 4 MG PO TBDP: 4.0000 mg | ORAL_TABLET | Freq: Four times a day (QID) | ORAL | Status: DC | PRN

## 2021-07-19 MED ORDER — IOHEXOL 350 MG/ML SOLN
100.0000 mL | Freq: Once | INTRAVENOUS | Status: AC | PRN
  Administered 2021-07-19: 100 mL via INTRAVENOUS

## 2021-07-19 MED ORDER — MORPHINE SULFATE (PF) 2 MG/ML IV SOLN
2.0000 mg | INTRAVENOUS | Status: DC | PRN
  Administered 2021-07-20: 2 mg via INTRAVENOUS
  Filled 2021-07-19: qty 1

## 2021-07-19 MED ORDER — ACETAMINOPHEN 325 MG PO TABS: 650.0000 mg | ORAL_TABLET | ORAL | Status: DC | PRN

## 2021-07-19 MED ORDER — TETANUS-DIPHTH-ACELL PERTUSSIS 5-2.5-18.5 LF-MCG/0.5 IM SUSY
0.5000 mL | PREFILLED_SYRINGE | Freq: Once | INTRAMUSCULAR | Status: AC
  Administered 2021-07-19: 0.5 mL via INTRAMUSCULAR
  Filled 2021-07-19: qty 0.5

## 2021-07-19 MED ORDER — FENTANYL CITRATE (PF) 100 MCG/2ML IJ SOLN
INTRAMUSCULAR | Status: AC
  Administered 2021-07-19: 50 ug
  Filled 2021-07-19: qty 2

## 2021-07-19 MED ORDER — HYDROMORPHONE HCL 1 MG/ML IJ SOLN
1.0000 mg | INTRAMUSCULAR | Status: DC | PRN
  Administered 2021-07-19: 1 mg via INTRAVENOUS
  Filled 2021-07-19: qty 1

## 2021-07-19 MED ORDER — FENTANYL CITRATE PF 50 MCG/ML IJ SOSY: 50.0000 ug | PREFILLED_SYRINGE | Freq: Once | INTRAMUSCULAR | Status: DC

## 2021-07-19 MED ORDER — DOCUSATE SODIUM 100 MG PO CAPS
100.0000 mg | ORAL_CAPSULE | Freq: Two times a day (BID) | ORAL | Status: DC
  Administered 2021-07-19 – 2021-07-22 (×7): 100 mg via ORAL
  Filled 2021-07-19 (×8): qty 1

## 2021-07-19 NOTE — ED Notes (Signed)
Patient transported to CT w/ [place]

## 2021-07-19 NOTE — ED Notes (Signed)
C-collar changed to [place] per Dr Kieth Brightly
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
## 2021-07-20 LAB — CBC
HCT: 31.8 % — ABNORMAL LOW (ref 36.0–46.0)
HCT: 29.2 % — ABNORMAL LOW (ref 36.0–46.0)
Hemoglobin: 9.6 g/dL — ABNORMAL LOW (ref 12.0–15.0)
Hemoglobin: 8.6 g/dL — ABNORMAL LOW (ref 12.0–15.0)
MCH: 21.1 pg — ABNORMAL LOW (ref 26.0–34.0)
MCH: 20.4 pg — ABNORMAL LOW (ref 26.0–34.0)
MCHC: 30.2 g/dL (ref 30.0–36.0)
MCHC: 29.5 g/dL — ABNORMAL LOW (ref 30.0–36.0)
MCV: 69.7 fL — ABNORMAL LOW (ref 80.0–100.0)
MCV: 69.4 fL — ABNORMAL LOW (ref 80.0–100.0)
Platelets: 351 10*3/uL (ref 150–400)
Platelets: 318 10*3/uL (ref 150–400)
RBC: 4.56 MIL/uL (ref 3.87–5.11)
RBC: 4.21 MIL/uL (ref 3.87–5.11)
RDW: 19.6 % — ABNORMAL HIGH (ref 11.5–15.5)
RDW: 19.5 % — ABNORMAL HIGH (ref 11.5–15.5)
WBC: 12.2 10*3/uL — ABNORMAL HIGH (ref 4.0–10.5)
WBC: 7.2 10*3/uL (ref 4.0–10.5)
nRBC: 0 % (ref 0.0–0.2)
nRBC: 0 % (ref 0.0–0.2)

## 2021-07-20 LAB — HIV ANTIBODY (ROUTINE TESTING W REFLEX): HIV Screen 4th Generation wRfx: NONREACTIVE

## 2021-07-20 MED ORDER — MORPHINE SULFATE (PF) 2 MG/ML IV SOLN: 1.0000 mg | INTRAVENOUS | Status: DC | PRN

## 2021-07-20 MED ORDER — ACETAMINOPHEN 500 MG PO TABS
1000.0000 mg | ORAL_TABLET | Freq: Four times a day (QID) | ORAL | Status: DC
  Administered 2021-07-20 – 2021-07-23 (×11): 1000 mg via ORAL
  Filled 2021-07-20 (×11): qty 2

## 2021-07-20 MED ORDER — OXYCODONE HCL 5 MG PO TABS
5.0000 mg | ORAL_TABLET | ORAL | Status: DC | PRN
  Administered 2021-07-22: 5 mg via ORAL
  Filled 2021-07-20: qty 1

## 2021-07-20 MED ORDER — METHOCARBAMOL 750 MG PO TABS
750.0000 mg | ORAL_TABLET | Freq: Three times a day (TID) | ORAL | Status: DC
  Administered 2021-07-20 – 2021-07-23 (×9): 750 mg via ORAL
  Filled 2021-07-20 (×9): qty 1
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
## 2021-07-20 NOTE — TOC CAGE-AID Note (Signed)
Transition of Care [facility]) - CAGE-AID Screening   Patient Details  Name: Michelle Hardy MRN: [id number] Date of Birth: [birth year]  [facility] CM/SW Contact:    Erminio Nygard C Tarpley-Carter, [place] Phone Number: 07/20/2021, 1:58 PM   Clinical Narrative: Pt participated in [place].  Pt stated she does not use substance or ETOH.  Pt was not offered resources, due to no usage of substance or ETOH.     Cristopher Ciccarelli Tarpley-Carter, MSW, LCSW-A Pronouns:  She/Her/Hers [facility] Clinical Social Worker Direct Number:  [phone] [email or web address]  CAGE-AID Screening:    Have You Ever Felt You Ought to Cut Down on Your Drinking or Drug Use?: No Have People Annoyed You By [employer] Your Drinking Or Drug Use?: No Have You Felt Bad Or Guilty About Your Drinking Or Drug Use?: No Have You Ever Had a Drink or Used Drugs First Thing In The Morning to Steady Your Nerves or to Get Rid of a Hangover?: No CAGE-AID Score: 0  Substance Abuse Education Offered: No
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
## 2021-07-20 NOTE — Discharge Instructions (Signed)
You have a splenic laceration not a liver laceration, but this information is still pertinent and good information
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
## 2021-07-21 LAB — URINALYSIS, ROUTINE W REFLEX MICROSCOPIC
Bilirubin Urine: NEGATIVE
Glucose, UA: NEGATIVE mg/dL
Hgb urine dipstick: NEGATIVE
Ketones, ur: NEGATIVE mg/dL
Nitrite: NEGATIVE
Protein, ur: NEGATIVE mg/dL
Specific Gravity, Urine: 1.015 (ref 1.005–1.030)
pH: 6 (ref 5.0–8.0)

## 2021-07-21 LAB — CBC
HCT: 26.8 % — ABNORMAL LOW (ref 36.0–46.0)
Hemoglobin: 7.9 g/dL — ABNORMAL LOW (ref 12.0–15.0)
MCH: 20.5 pg — ABNORMAL LOW (ref 26.0–34.0)
MCHC: 29.5 g/dL — ABNORMAL LOW (ref 30.0–36.0)
MCV: 69.6 fL — ABNORMAL LOW (ref 80.0–100.0)
Platelets: 279 10*3/uL (ref 150–400)
RBC: 3.85 MIL/uL — ABNORMAL LOW (ref 3.87–5.11)
RDW: 19.5 % — ABNORMAL HIGH (ref 11.5–15.5)
WBC: 4.4 10*3/uL (ref 4.0–10.5)
nRBC: 0 % (ref 0.0–0.2)

## 2021-07-21 LAB — URINALYSIS, MICROSCOPIC (REFLEX)

## 2021-07-21 MED ORDER — ENOXAPARIN SODIUM 40 MG/0.4ML IJ SOSY
40.0000 mg | PREFILLED_SYRINGE | INTRAMUSCULAR | Status: DC
  Administered 2021-07-23: 40 mg via SUBCUTANEOUS
  Filled 2021-07-21 (×2): qty 0.4

## 2021-07-21 NOTE — Progress Notes (Signed)
Patient complained last night of some left neck swelling and discomfort.  She is able to swallow but it is uncomfortable.  Assessed and found some swelling.  Patient just wanted MD to know when making rounds today.
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
## 2021-07-22 MED ORDER — POLYETHYLENE GLYCOL 3350 17 G PO PACK
17.0000 g | PACK | Freq: Two times a day (BID) | ORAL | Status: DC
  Administered 2021-07-22 (×2): 17 g via ORAL
  Filled 2021-07-22 (×3): qty 1
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
## 2021-07-23 ENCOUNTER — Encounter: Payer: Self-pay | Admitting: Obstetrics & Gynecology

## 2021-07-23 DIAGNOSIS — T1490XA Injury, unspecified, initial encounter: Secondary | ICD-10-CM | POA: Diagnosis present

## 2021-07-23 DIAGNOSIS — S2242XA Multiple fractures of ribs, left side, initial encounter for closed fracture: Secondary | ICD-10-CM

## 2021-07-23 HISTORY — DX: Multiple fractures of ribs, left side, initial encounter for closed fracture: S22.42XA

## 2021-07-23 HISTORY — DX: Injury, unspecified, initial encounter: T14.90XA

## 2021-07-23 MED ORDER — ONDANSETRON 4 MG PO TBDP: 4.0000 mg | ORAL_TABLET | Freq: Four times a day (QID) | ORAL | 0 refills | Status: DC | PRN

## 2021-07-23 MED ORDER — BACITRACIN ZINC 500 UNIT/GM EX OINT: TOPICAL_OINTMENT | Freq: Two times a day (BID) | CUTANEOUS | 0 refills | Status: DC

## 2021-07-23 MED ORDER — OXYCODONE HCL 5 MG PO TABS: 5.0000 mg | ORAL_TABLET | Freq: Four times a day (QID) | ORAL | 0 refills | Status: AC | PRN

## 2021-07-23 MED ORDER — DOCUSATE SODIUM 100 MG PO CAPS: 100.0000 mg | ORAL_CAPSULE | Freq: Two times a day (BID) | ORAL | 0 refills | Status: DC

## 2021-07-23 MED ORDER — ACETAMINOPHEN 500 MG PO TABS: 1000.0000 mg | ORAL_TABLET | Freq: Four times a day (QID) | ORAL | 0 refills | Status: DC

## 2021-07-23 MED ORDER — METHOCARBAMOL 750 MG PO TABS: 750.0000 mg | ORAL_TABLET | Freq: Three times a day (TID) | ORAL | 0 refills | Status: AC | PRN

## 2021-07-23 MED ORDER — POLYETHYLENE GLYCOL 3350 17 G PO PACK: 17.0000 g | PACK | Freq: Every day | ORAL | 0 refills | Status: DC | PRN

## 2021-07-23 MED ORDER — BACITRACIN ZINC 500 UNIT/GM EX OINT
TOPICAL_OINTMENT | Freq: Two times a day (BID) | CUTANEOUS | Status: DC
  Filled 2021-07-23: qty 28.4

## 2021-07-23 NOTE — Progress Notes (Signed)
Discharge instructions (including medications) discussed with and copy provided to patient/caregiver 

## 2021-07-23 NOTE — Plan of Care (Signed)
  Problem: Education: Goal: Knowledge of General Education information will improve Description: Including pain rating scale, medication(s)/side effects and non-pharmacologic comfort measures Outcome: Adequate for Discharge   
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
## 2021-07-23 NOTE — TOC Transition Note (Signed)
Transition of Care [facility]) - CM/SW Discharge Note   Patient Details  Name: Michelle Hardy MRN: [id number] Date of Birth: Apr 09, 1981  Transition of Care [facility]) CM/SW Contact:  Ella Bodo, RN Phone Number: 07/23/2021, 12:03 PM   Clinical Narrative:   Pt medically stable for discharge home today with assistance from friends.  Patient states DME has been delivered to room.  OP therapy referral has been made, as recommended.  No other dc needs identified.     Final next level of care: OP Rehab Barriers to Discharge: Barriers Resolved   Patient Goals and CMS Choice Patient states their goals for this hospitalization and ongoing recovery are:: to go home                             Discharge Plan and Services   Discharge Planning Services: CM Consult            DME Arranged: 3-N-1, Walker rolling DME Agency: [employer] Date DME Agency Contacted: 07/20/21 Time DME Agency Contacted: [phone] Representative spoke with at DME Agency: [employer] Determinants of Health ([place]) Interventions     Readmission Risk Interventions No flowsheet data found.  Reinaldo Raddle, RN, BSN  Trauma/Neuro ICU Case Manager [phone]
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
## 2021-07-27 ENCOUNTER — Encounter: Payer: Self-pay | Admitting: Emergency Medicine

## 2021-07-27 ENCOUNTER — Emergency Department
Admission: EM | Admit: 2021-07-27 | Discharge: 2021-07-27 | Disposition: A | Discharge status: Home / Self Care | Payer: Self-pay | Attending: Emergency Medicine | Admitting: Emergency Medicine

## 2021-07-27 ENCOUNTER — Other Ambulatory Visit: Payer: Self-pay

## 2021-07-27 DIAGNOSIS — Y92410 Unspecified street and highway as the place of occurrence of the external cause: Secondary | ICD-10-CM | POA: Insufficient documentation

## 2021-07-27 DIAGNOSIS — H53149 Visual discomfort, unspecified: Secondary | ICD-10-CM | POA: Insufficient documentation

## 2021-07-27 DIAGNOSIS — S161XXA Strain of muscle, fascia and tendon at neck level, initial encounter: Secondary | ICD-10-CM | POA: Insufficient documentation

## 2021-07-27 DIAGNOSIS — J45909 Unspecified asthma, uncomplicated: Secondary | ICD-10-CM | POA: Insufficient documentation

## 2021-07-27 DIAGNOSIS — R519 Headache, unspecified: Secondary | ICD-10-CM | POA: Insufficient documentation

## 2021-07-27 NOTE — Discharge Instructions (Addendum)
Please call orthopedics Monday morning to schedule follow-up appointment.  Return to the ER for any worsening symptoms or urgent changes in your health.  Please start taking medications prescribed to you by Jobe Marker.  Also call PT facility Monday morning and discuss referral that was placed by [facility] trauma team
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
## 2021-07-27 NOTE — ED Notes (Signed)
Pt NAD, a/ox4. Pt verbalizes understanding of all DC and f/u instructions. All questions answered. Pt walks with steady gait with walker to lobby at DC.

## 2021-08-01 ENCOUNTER — Ambulatory Visit: Payer: Self-pay | Attending: General Surgery

## 2021-08-01 ENCOUNTER — Other Ambulatory Visit: Payer: Self-pay

## 2021-08-01 DIAGNOSIS — M25611 Stiffness of right shoulder, not elsewhere classified: Secondary | ICD-10-CM | POA: Insufficient documentation

## 2021-08-01 DIAGNOSIS — M542 Cervicalgia: Secondary | ICD-10-CM | POA: Insufficient documentation

## 2021-08-01 DIAGNOSIS — M5412 Radiculopathy, cervical region: Secondary | ICD-10-CM | POA: Insufficient documentation
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
## 2021-08-07 ENCOUNTER — Ambulatory Visit: Payer: Self-pay | Admitting: Physical Therapy

## 2021-08-07 ENCOUNTER — Encounter: Payer: Self-pay | Admitting: Physical Therapy

## 2021-08-07 ENCOUNTER — Other Ambulatory Visit: Payer: Self-pay

## 2021-08-07 DIAGNOSIS — M542 Cervicalgia: Secondary | ICD-10-CM

## 2021-08-07 DIAGNOSIS — M25611 Stiffness of right shoulder, not elsewhere classified: Secondary | ICD-10-CM

## 2021-08-07 DIAGNOSIS — M5412 Radiculopathy, cervical region: Secondary | ICD-10-CM
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
## 2021-08-14 ENCOUNTER — Ambulatory Visit: Payer: Self-pay | Attending: General Surgery

## 2021-08-14 ENCOUNTER — Other Ambulatory Visit: Payer: Self-pay

## 2021-08-14 DIAGNOSIS — M5412 Radiculopathy, cervical region: Secondary | ICD-10-CM | POA: Insufficient documentation

## 2021-08-14 DIAGNOSIS — M542 Cervicalgia: Secondary | ICD-10-CM | POA: Insufficient documentation

## 2021-08-14 DIAGNOSIS — M25611 Stiffness of right shoulder, not elsewhere classified: Secondary | ICD-10-CM | POA: Insufficient documentation
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
## 2021-08-21 ENCOUNTER — Other Ambulatory Visit: Payer: Self-pay

## 2021-08-21 ENCOUNTER — Ambulatory Visit: Payer: Self-pay

## 2021-08-21 DIAGNOSIS — M25611 Stiffness of right shoulder, not elsewhere classified: Secondary | ICD-10-CM

## 2021-08-21 DIAGNOSIS — M5412 Radiculopathy, cervical region: Secondary | ICD-10-CM

## 2021-08-21 DIAGNOSIS — M542 Cervicalgia: Secondary | ICD-10-CM
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
## 2021-08-28 ENCOUNTER — Other Ambulatory Visit: Payer: Self-pay

## 2021-08-28 ENCOUNTER — Ambulatory Visit: Payer: Self-pay

## 2021-08-28 DIAGNOSIS — M25611 Stiffness of right shoulder, not elsewhere classified: Secondary | ICD-10-CM

## 2021-08-28 DIAGNOSIS — M5412 Radiculopathy, cervical region: Secondary | ICD-10-CM

## 2021-08-28 DIAGNOSIS — M542 Cervicalgia: Secondary | ICD-10-CM
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
## 2021-09-06 ENCOUNTER — Ambulatory Visit: Payer: Self-pay

## 2021-09-11 ENCOUNTER — Ambulatory Visit: Payer: Self-pay

## 2021-09-18 ENCOUNTER — Ambulatory Visit: Payer: Self-pay

## 2021-09-25 ENCOUNTER — Ambulatory Visit: Payer: Self-pay

## 2021-10-02 ENCOUNTER — Ambulatory Visit: Payer: Self-pay

## 2021-10-09 ENCOUNTER — Ambulatory Visit: Payer: Self-pay

## 2021-10-16 ENCOUNTER — Ambulatory Visit: Payer: Self-pay

## 2022-02-10 ENCOUNTER — Ambulatory Visit
Admission: EM | Admit: 2022-02-10 | Discharge: 2022-02-10 | Disposition: A | Discharge status: Home / Self Care | Payer: BC Managed Care – PPO | Attending: Family Medicine | Admitting: Family Medicine

## 2022-02-10 ENCOUNTER — Encounter: Payer: Self-pay | Admitting: Emergency Medicine

## 2022-02-10 DIAGNOSIS — M5441 Lumbago with sciatica, right side: Secondary | ICD-10-CM

## 2022-02-10 DIAGNOSIS — M5442 Lumbago with sciatica, left side: Secondary | ICD-10-CM

## 2022-02-10 MED ORDER — DEXAMETHASONE SODIUM PHOSPHATE 10 MG/ML IJ SOLN
10.0000 mg | Freq: Once | INTRAMUSCULAR | Status: AC
  Administered 2022-02-10: 10 mg via INTRAMUSCULAR

## 2022-02-10 MED ORDER — TIZANIDINE HCL 2 MG PO TABS: 2.0000 mg | ORAL_TABLET | Freq: Four times a day (QID) | ORAL | 0 refills | Status: DC | PRN

## 2022-02-10 MED ORDER — PREDNISONE 20 MG PO TABS: 20.0000 mg | ORAL_TABLET | Freq: Every day | ORAL | 0 refills | Status: AC
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
## 2022-02-10 NOTE — ED Triage Notes (Signed)
Pt presents with lower back pain x 2 weeks. Pt states it starts as a burning sensation then some tightening.  ?

## 2022-06-04 ENCOUNTER — Emergency Department: Payer: BC Managed Care – PPO

## 2022-06-04 ENCOUNTER — Other Ambulatory Visit: Payer: Self-pay

## 2022-06-04 ENCOUNTER — Emergency Department
Admission: EM | Admit: 2022-06-04 | Discharge: 2022-06-04 | Disposition: A | Discharge status: Home / Self Care | Payer: BC Managed Care – PPO | Attending: Emergency Medicine | Admitting: Emergency Medicine

## 2022-06-04 ENCOUNTER — Other Ambulatory Visit
Admission: RE | Admit: 2022-06-04 | Discharge: 2022-06-04 | Disposition: A | Discharge status: Home / Self Care | Payer: BC Managed Care – PPO | Source: Ambulatory Visit | Attending: Family Medicine | Admitting: Family Medicine

## 2022-06-04 DIAGNOSIS — R079 Chest pain, unspecified: Secondary | ICD-10-CM | POA: Diagnosis not present

## 2022-06-04 DIAGNOSIS — R0602 Shortness of breath: Secondary | ICD-10-CM | POA: Insufficient documentation

## 2022-06-04 DIAGNOSIS — Z03818 Encounter for observation for suspected exposure to other biological agents ruled out: Secondary | ICD-10-CM | POA: Insufficient documentation

## 2022-06-04 DIAGNOSIS — R000 Tachycardia, unspecified: Secondary | ICD-10-CM | POA: Diagnosis not present

## 2022-06-04 DIAGNOSIS — R0789 Other chest pain: Secondary | ICD-10-CM | POA: Insufficient documentation

## 2022-06-04 DIAGNOSIS — R42 Dizziness and giddiness: Secondary | ICD-10-CM | POA: Diagnosis not present

## 2022-06-04 DIAGNOSIS — R0781 Pleurodynia: Secondary | ICD-10-CM | POA: Diagnosis not present

## 2022-06-04 LAB — COMPREHENSIVE METABOLIC PANEL
ALT: 15 U/L (ref 0–44)
AST: 18 U/L (ref 15–41)
Albumin: 4.4 g/dL (ref 3.5–5.0)
Alkaline Phosphatase: 59 U/L (ref 38–126)
Anion gap: 6 (ref 5–15)
BUN: 10 mg/dL (ref 6–20)
CO2: 24 mmol/L (ref 22–32)
Calcium: 9.4 mg/dL (ref 8.9–10.3)
Chloride: 107 mmol/L (ref 98–111)
Creatinine, Ser: 0.65 mg/dL (ref 0.44–1.00)
GFR, Estimated: >60 mL/min (ref >60)
Glucose, Bld: 101 mg/dL — ABNORMAL HIGH (ref 70–99)
Potassium: 3.7 mmol/L (ref 3.5–5.1)
Sodium: 137 mmol/L (ref 135–145)
Total Bilirubin: 0.5 mg/dL (ref 0.3–1.2)
Total Protein: 8.6 g/dL — ABNORMAL HIGH (ref 6.5–8.1)

## 2022-06-04 LAB — URINALYSIS, ROUTINE W REFLEX MICROSCOPIC
Bilirubin Urine: NEGATIVE
Glucose, UA: NEGATIVE mg/dL
Hgb urine dipstick: NEGATIVE
Ketones, ur: NEGATIVE mg/dL
Leukocytes,Ua: NEGATIVE
Nitrite: NEGATIVE
Protein, ur: NEGATIVE mg/dL
Specific Gravity, Urine: >1.046 — ABNORMAL HIGH (ref 1.005–1.030)
pH: 8 (ref 5.0–8.0)

## 2022-06-04 LAB — CBC WITH DIFFERENTIAL/PLATELET
Abs Immature Granulocytes: 0.05 10*3/uL (ref 0.00–0.07)
Basophils Absolute: 0 10*3/uL (ref 0.0–0.1)
Basophils Relative: 0 %
Eosinophils Absolute: 0 10*3/uL (ref 0.0–0.5)
Eosinophils Relative: 1 %
HCT: 30.3 % — ABNORMAL LOW (ref 36.0–46.0)
Hemoglobin: 8.4 g/dL — ABNORMAL LOW (ref 12.0–15.0)
Immature Granulocytes: 1 %
Lymphocytes Relative: 11 %
Lymphs Abs: 0.8 10*3/uL (ref 0.7–4.0)
MCH: 17.1 pg — ABNORMAL LOW (ref 26.0–34.0)
MCHC: 27.7 g/dL — ABNORMAL LOW (ref 30.0–36.0)
MCV: 61.7 fL — ABNORMAL LOW (ref 80.0–100.0)
Monocytes Absolute: 0.2 10*3/uL (ref 0.1–1.0)
Monocytes Relative: 4 %
Neutro Abs: 5.5 10*3/uL (ref 1.7–7.7)
Neutrophils Relative %: 83 %
Platelets: 329 10*3/uL (ref 150–400)
RBC: 4.91 MIL/uL (ref 3.87–5.11)
RDW: 20.9 % — ABNORMAL HIGH (ref 11.5–15.5)
Smear Review: NORMAL
WBC: 6.6 10*3/uL (ref 4.0–10.5)
nRBC: 0 % (ref 0.0–0.2)

## 2022-06-04 LAB — TROPONIN I (HIGH SENSITIVITY)
Troponin I (High Sensitivity): 3 ng/L (ref <18)
Troponin I (High Sensitivity): <2 ng/L (ref <18)

## 2022-06-04 LAB — D-DIMER, QUANTITATIVE: D-Dimer, Quant: 0.69 ug/mL-FEU — ABNORMAL HIGH (ref 0.00–0.50)

## 2022-06-04 LAB — PROTIME-INR
INR: 1.1 (ref 0.8–1.2)
Prothrombin Time: 13.7 seconds (ref 11.4–15.2)

## 2022-06-04 LAB — POC URINE PREG, ED: Preg Test, Ur: NEGATIVE

## 2022-06-04 MED ORDER — ALBUTEROL SULFATE HFA 108 (90 BASE) MCG/ACT IN AERS: 2.0000 | INHALATION_SPRAY | Freq: Four times a day (QID) | RESPIRATORY_TRACT | 2 refills | Status: AC | PRN

## 2022-06-04 MED ORDER — PREDNISONE 10 MG (21) PO TBPK: ORAL_TABLET | ORAL | 0 refills | Status: DC

## 2022-06-04 MED ORDER — IOHEXOL 350 MG/ML SOLN
75.0000 mL | Freq: Once | INTRAVENOUS | Status: AC | PRN
  Administered 2022-06-04: 75 mL via INTRAVENOUS

## 2022-06-04 NOTE — ED Provider Notes (Signed)
[facility] Provider Note  Patient Contact: 8:16 PM (approximate)   History   Chest Pain and Shortness of Breath   HPI  Michelle Hardy is a 41 y.o. female with a history of asthma and anemia, presents to the emergency department as patient has had 2 weeks of dry cough and some right-sided sharp chest pain that can occur with cough.  Patient has also had some associated shortness of breath.  She was evaluated at [facility] prior to coming to the emergency department and had an elevated D-dimer.  Patient states that she is on the waiting list for a primary care provider but states that she is aware of her history of symptomatic anemia and takes liquid iron twice a week.  She states that its been a long time since she has had her iron levels checked.  No nausea, vomiting or fever.      Physical Exam   Triage Vital Signs: ED Triage Vitals  Enc Vitals Group     BP 06/04/22 1724 (!) 133/91     Pulse Rate 06/04/22 1724 98     Resp 06/04/22 1724 20     Temp 06/04/22 1724 98.6 F (37 C)     Temp Source 06/04/22 1724 Oral     SpO2 06/04/22 1724 100 %     Weight 06/04/22 1725 183 lb (83 kg)     Height 06/04/22 1725 '5\' 7"'$  (1.702 m)     Head Circumference --      Peak Flow --      Pain Score 06/04/22 1724 8     Pain Loc --      Pain Edu? --      Excl. in [place]? --     Most recent vital signs: Vitals:   06/04/22 1724 06/04/22 2031  BP: (!) 133/91 136/80  Pulse: 98 100  Resp: 20 20  Temp: 98.6 F (37 C)   SpO2: 100% 99%     General: Alert and in no acute distress. Eyes:  PERRL. EOMI. Head: No acute traumatic findings ENT:      Nose: No congestion/rhinnorhea.      Mouth/Throat: Mucous membranes are moist. Neck: No stridor. No cervical spine tenderness to palpation. Cardiovascular:  Good peripheral perfusion Respiratory: Normal respiratory effort without tachypnea or retractions. Lungs CTAB. Good air entry to the bases with no decreased or absent  breath sounds. Gastrointestinal: Bowel sounds 4 quadrants. Soft and nontender to palpation. No guarding or rigidity. No palpable masses. No distention. No CVA tenderness. Musculoskeletal: Full range of motion to all extremities.  Neurologic:  No gross focal neurologic deficits are appreciated.  Skin:   No rash noted    ED Results / Procedures / Treatments   Labs (all labs ordered are listed, but only abnormal results are displayed) Labs Reviewed  COMPREHENSIVE METABOLIC PANEL - Abnormal; Notable for the following components:      Result Value   Glucose, Bld 101 (*)    Total Protein 8.6 (*)    All other components within normal limits  CBC WITH DIFFERENTIAL/PLATELET - Abnormal; Notable for the following components:   Hemoglobin 8.4 (*)    HCT 30.3 (*)    MCV 61.7 (*)    MCH 17.1 (*)    MCHC 27.7 (*)    RDW 20.9 (*)    All other components within normal limits  URINALYSIS, ROUTINE W REFLEX MICROSCOPIC - Abnormal; Notable for the following components:   Color, Urine YELLOW (*)  APPearance CLEAR (*)    Specific Gravity, Urine >1.046 (*)    All other components within normal limits  PROTIME-INR  POC URINE PREG, ED  TROPONIN I (HIGH SENSITIVITY)  TROPONIN I (HIGH SENSITIVITY)     EKG  EKG indicates normal sinus rhythm without ST segment elevation or other apparent arrhythmia.   RADIOLOGY  I personally viewed and evaluated these images as part of my medical decision making, as well as reviewing the written report by the radiologist.  ED Provider Interpretation: No PE on CTA.  I agree with radiologist interpretation.   PROCEDURES:  Critical Care performed: No  Procedures   MEDICATIONS ORDERED IN ED: Medications  iohexol (OMNIPAQUE) 350 MG/ML injection 75 mL (75 mLs Intravenous Contrast Given 06/04/22 1816)     IMPRESSION / MDM / ASSESSMENT AND PLAN / ED COURSE  I reviewed the triage vital signs and the nursing notes.                               Assessment and plan SOB:  41 year old female with history of symptomatic anemia and asthma, presents to the emergency department with shortness of breath and some right-sided chest pain that occurs with cough for the past 2 weeks.  Vital signs were reassuring at triage.  On exam, patient was alert, active and nontoxic-appearing.  She was speaking in complete sentences without perceived breathlessness.  I did not auscultate any wheezes on exam.  I reviewed patient's labs including her hemoglobin.  Patient states that she has a history of anemia but was not sure about her prior hemoglobin levels.  She states that she was on a waiting list to see a primary care provider and anticipated being seen in October.  I helped patient schedule an appointment through the [facility] health website and patient has an appointment with a primary care provider in [place] on July 27 at 2:20 PM.  CBC indicated hemoglobin of 8.4 but was otherwise unremarkable.  CMP within reference range.   Delta troponin within range.  Urine pregnancy test negative.  Care plan includes sending patient home with tapered prednisone which will cover for bronchitis and asthma exacerbation and refill her albuterol inhaler.  Will wait to add on labs for symptomatic anemia until patient can be seen on July 27 by her new primary care provider.   FINAL CLINICAL IMPRESSION(S) / ED DIAGNOSES   Final diagnoses:  Other chest pain  SOB (shortness of breath)     Rx / DC Orders   ED Discharge Orders          Ordered    predniSONE (STERAPRED UNI-PAK 21 TAB) 10 MG (21) TBPK tablet        06/04/22 2101    albuterol (VENTOLIN HFA) 108 (90 Base) MCG/ACT inhaler  Every 6 hours PRN        06/04/22 2101             Note:  This document was prepared using Dragon voice recognition software and may include unintentional dictation errors.   Vallarie Mare [place], PA-C 06/04/22 2150    Duffy Bruce, MD 06/04/22 [phone]

## 2022-06-04 NOTE — ED Provider Triage Note (Signed)
Emergency Medicine Provider Triage Evaluation Note  Secret Kristensen , a 41 y.o. female  was evaluated in triage.  Pt complains of sob, elevated ddimer at [facility], told to come to ER.  Review of Systems  Positive: Sob, cp Negative: fever  Physical Exam  BP (!) 133/91 (BP Location: Left Arm)   Pulse 98   Temp 98.6 F (37 C) (Oral)   Resp 20   SpO2 100%  Gen:   Awake, no distress   Resp:  Normal effort  MSK:   Moves extremities without difficulty  Other:    Medical Decision Making  Medically screening exam initiated at 5:25 PM.  Appropriate orders placed.  Brande Uncapher was informed that the remainder of the evaluation will be completed by another provider, this initial triage assessment does not replace that evaluation, and the importance of remaining in the ED until their evaluation is complete.  Cta for pe ordered, labs    Versie Starks, PA-C 06/04/22 1726

## 2022-06-04 NOTE — ED Triage Notes (Signed)
Patient brought over by [facility] for further evaluation for shortness of breath, chest pain and tachycardia. Patient was told she has elevated D-dimer and low hemoglobin.

## 2022-06-04 NOTE — ED Triage Notes (Signed)
Pt c/o SOB x "a while" and chest pain x2 weeks.  Pt was seen at [facility] earlier.  Pt had blood work and was given a breathing treatment and a steroid shot.

## 2022-06-04 NOTE — Discharge Instructions (Addendum)
Take tapered prednisone as directed. You can take 2 puffs of albuterol every 4 hours as needed for shortness of breath. Please keep appointment with primary care.

## 2022-06-05 DIAGNOSIS — R079 Chest pain, unspecified: Secondary | ICD-10-CM | POA: Diagnosis not present

## 2022-06-05 DIAGNOSIS — R0602 Shortness of breath: Secondary | ICD-10-CM | POA: Diagnosis not present

## 2022-06-06 ENCOUNTER — Ambulatory Visit: Payer: BC Managed Care – PPO | Admitting: Family Medicine

## 2022-06-06 ENCOUNTER — Encounter: Payer: Self-pay | Admitting: Family Medicine

## 2022-06-06 VITALS — BP 124/82 | HR 108 | Temp 97.5°F | Ht 67.0 in | Wt 184.4 lb

## 2022-06-06 DIAGNOSIS — R0789 Other chest pain: Secondary | ICD-10-CM | POA: Diagnosis not present

## 2022-06-06 DIAGNOSIS — D500 Iron deficiency anemia secondary to blood loss (chronic): Secondary | ICD-10-CM | POA: Diagnosis not present

## 2022-06-06 DIAGNOSIS — N921 Excessive and frequent menstruation with irregular cycle: Secondary | ICD-10-CM

## 2022-06-06 DIAGNOSIS — R058 Other specified cough: Secondary | ICD-10-CM | POA: Insufficient documentation

## 2022-06-06 HISTORY — DX: Other chest pain: R07.89

## 2022-06-06 HISTORY — DX: Other specified cough: R05.8

## 2022-06-06 MED ORDER — NAPROXEN 500 MG PO TABS: 500.0000 mg | ORAL_TABLET | Freq: Two times a day (BID) | ORAL | 0 refills | Status: AC

## 2022-06-06 MED ORDER — BENZONATATE 200 MG PO CAPS: 200.0000 mg | ORAL_CAPSULE | Freq: Two times a day (BID) | ORAL | 0 refills | Status: DC | PRN
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
## 2022-06-06 NOTE — Assessment & Plan Note (Signed)
Chronic likely associated with menorrhagia Patient status post 2 myomectomies, still has significant menorrhagia Has been lost to follow-up with OB/GYN, referral placed today Given persistence of anemia, and patient has failed oral iron supplements, will refer to hematology for further input as well

## 2022-06-06 NOTE — Assessment & Plan Note (Signed)
Given negative work-up at ED, low concern for cardiac abnormalities Associated dyspnea Normal oxygen saturation and no wheeze on exam, believe that she has had resolution of reactive airway disease status post treatment with steroids from the ED Significant tenderness and recent viral infection, points to patient chest pain being costochondritis or related MSK pain Dyspnea is likely multifactorial given recent viral infection, chronic anemia, persistent chest wall pain Recommend increasing NSAID strength to naproxen 500 twice daily as needed Given pain with cough, will treat with Tessalon perles Patient follow-up in 1 week

## 2022-06-06 NOTE — Patient Instructions (Signed)
We are sending you to hematology and OB/GYN to assist with heavy bleeding with menses and anemia For cough and chest pain, take Tessalon Perles and naproxen.  May also use over-the-counter dextromethorphan (Robitussin, Delsym, etc.)

## 2022-06-13 ENCOUNTER — Encounter: Payer: Self-pay | Admitting: Family Medicine

## 2022-06-13 ENCOUNTER — Ambulatory Visit: Payer: BC Managed Care – PPO | Admitting: Family Medicine

## 2022-06-13 VITALS — BP 132/82 | HR 103 | Temp 97.5°F | Wt 189.4 lb

## 2022-06-13 DIAGNOSIS — F0781 Postconcussional syndrome: Secondary | ICD-10-CM

## 2022-06-13 DIAGNOSIS — R0982 Postnasal drip: Secondary | ICD-10-CM | POA: Diagnosis not present

## 2022-06-13 DIAGNOSIS — J4541 Moderate persistent asthma with (acute) exacerbation: Secondary | ICD-10-CM | POA: Diagnosis not present

## 2022-06-13 DIAGNOSIS — R0789 Other chest pain: Secondary | ICD-10-CM

## 2022-06-13 MED ORDER — NEBULIZER SYSTEM ALL-IN-ONE MISC: 1.0000 | 0 refills | Status: AC | PRN

## 2022-06-13 MED ORDER — FLUTICASONE PROPIONATE 50 MCG/ACT NA SUSP: 2.0000 | Freq: Every day | NASAL | 6 refills | Status: DC

## 2022-06-13 MED ORDER — IPRATROPIUM-ALBUTEROL 0.5-2.5 (3) MG/3ML IN SOLN: 3.0000 mL | RESPIRATORY_TRACT | 0 refills | Status: DC | PRN

## 2022-06-13 MED ORDER — IBUPROFEN 800 MG PO TABS
800.0000 mg | ORAL_TABLET | Freq: Three times a day (TID) | ORAL | 0 refills | Status: DC | PRN
Start: 2022-06-13 — End: 2023-01-30

## 2022-06-13 MED ORDER — GUAIFENESIN-CODEINE 200-10 MG/5ML PO LIQD: 10.0000 mL | ORAL | 0 refills | Status: DC | PRN

## 2022-06-13 NOTE — Assessment & Plan Note (Signed)
Cough likely related to postviral asthma exacerbation Status post steroids, previous CT negative for PE lung or cardiac abnormalities Recommend adding DuoNeb nebulizers, stop using albuterol moment Trial of codeine cough syrup for cough suppressant Stop naproxen, start ibuprofen for chest pain If no improvement within 1 week to 2 weeks, recommend referral to pulmonology
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
## 2022-06-13 NOTE — Patient Instructions (Addendum)
For cough, take cough syrup and nebulizer For chest pain, use ibuprofen  We referred to neurology for headaches

## 2022-06-14 ENCOUNTER — Other Ambulatory Visit: Payer: Self-pay | Admitting: Family Medicine

## 2022-06-14 DIAGNOSIS — J4541 Moderate persistent asthma with (acute) exacerbation: Secondary | ICD-10-CM

## 2022-06-14 DIAGNOSIS — R0789 Other chest pain: Secondary | ICD-10-CM

## 2022-06-14 MED ORDER — GUAIFENESIN-CODEINE 100-10 MG/5ML PO SOLN: 5.0000 mL | Freq: Three times a day (TID) | ORAL | 0 refills | Status: DC | PRN

## 2022-06-19 ENCOUNTER — Inpatient Hospital Stay: Payer: BC Managed Care – PPO | Attending: Internal Medicine | Admitting: Internal Medicine

## 2022-06-19 ENCOUNTER — Inpatient Hospital Stay: Payer: BC Managed Care – PPO

## 2022-06-19 ENCOUNTER — Encounter: Payer: Self-pay | Admitting: Internal Medicine

## 2022-06-19 DIAGNOSIS — N920 Excessive and frequent menstruation with regular cycle: Secondary | ICD-10-CM | POA: Insufficient documentation

## 2022-06-19 DIAGNOSIS — Z808 Family history of malignant neoplasm of other organs or systems: Secondary | ICD-10-CM | POA: Diagnosis not present

## 2022-06-19 DIAGNOSIS — D649 Anemia, unspecified: Secondary | ICD-10-CM | POA: Diagnosis not present

## 2022-06-19 DIAGNOSIS — J45909 Unspecified asthma, uncomplicated: Secondary | ICD-10-CM | POA: Diagnosis not present

## 2022-06-19 LAB — COMPREHENSIVE METABOLIC PANEL
ALT: 17 U/L (ref 0–44)
AST: 16 U/L (ref 15–41)
Albumin: 3.7 g/dL (ref 3.5–5.0)
Alkaline Phosphatase: 53 U/L (ref 38–126)
Anion gap: 8 (ref 5–15)
BUN: 9 mg/dL (ref 6–20)
CO2: 25 mmol/L (ref 22–32)
Calcium: 9 mg/dL (ref 8.9–10.3)
Chloride: 104 mmol/L (ref 98–111)
Creatinine, Ser: 0.76 mg/dL (ref 0.44–1.00)
GFR, Estimated: >60 mL/min (ref >60)
Glucose, Bld: 118 mg/dL — ABNORMAL HIGH (ref 70–99)
Potassium: 3.4 mmol/L — ABNORMAL LOW (ref 3.5–5.1)
Sodium: 137 mmol/L (ref 135–145)
Total Bilirubin: 0.3 mg/dL (ref 0.3–1.2)
Total Protein: 7.3 g/dL (ref 6.5–8.1)

## 2022-06-19 LAB — FERRITIN: Ferritin: 7 ng/mL — ABNORMAL LOW (ref 11–307)

## 2022-06-19 LAB — CBC WITH DIFFERENTIAL/PLATELET
Abs Immature Granulocytes: 0.13 10*3/uL — ABNORMAL HIGH (ref 0.00–0.07)
Basophils Absolute: 0 10*3/uL (ref 0.0–0.1)
Basophils Relative: 0 %
Eosinophils Absolute: 0.1 10*3/uL (ref 0.0–0.5)
Eosinophils Relative: 1 %
HCT: 27.8 % — ABNORMAL LOW (ref 36.0–46.0)
Hemoglobin: 7.8 g/dL — ABNORMAL LOW (ref 12.0–15.0)
Immature Granulocytes: 2 %
Lymphocytes Relative: 19 %
Lymphs Abs: 1.4 10*3/uL (ref 0.7–4.0)
MCH: 17.4 pg — ABNORMAL LOW (ref 26.0–34.0)
MCHC: 28.1 g/dL — ABNORMAL LOW (ref 30.0–36.0)
MCV: 61.9 fL — ABNORMAL LOW (ref 80.0–100.0)
Monocytes Absolute: 0.6 10*3/uL (ref 0.1–1.0)
Monocytes Relative: 7 %
Neutro Abs: 5.4 10*3/uL (ref 1.7–7.7)
Neutrophils Relative %: 71 %
Platelets: 401 10*3/uL — ABNORMAL HIGH (ref 150–400)
RBC: 4.49 MIL/uL (ref 3.87–5.11)
RDW: 21.7 % — ABNORMAL HIGH (ref 11.5–15.5)
WBC: 7.5 10*3/uL (ref 4.0–10.5)
nRBC: 0.3 % — ABNORMAL HIGH (ref 0.0–0.2)

## 2022-06-19 LAB — RETICULOCYTES
Immature Retic Fract: 40.4 % — ABNORMAL HIGH (ref 2.3–15.9)
RBC.: 4.32 MIL/uL (ref 3.87–5.11)
Retic Count, Absolute: 95 10*3/uL (ref 19.0–186.0)
Retic Ct Pct: 2.2 % (ref 0.4–3.1)

## 2022-06-19 LAB — IRON AND TIBC
Iron: 19 ug/dL — ABNORMAL LOW (ref 28–170)
Saturation Ratios: 4 % — ABNORMAL LOW (ref 10.4–31.8)
TIBC: 442 ug/dL (ref 250–450)
UIBC: 423 ug/dL

## 2022-06-19 LAB — LACTATE DEHYDROGENASE: LDH: 111 U/L (ref 98–192)

## 2022-06-19 NOTE — Progress Notes (Signed)
C/o chest pain x3 weeks, hurts when coughing, tender to the touch.

## 2022-06-19 NOTE — Progress Notes (Signed)
[facility] NOTE  Patient Care Team: Bonnita Hollow, MD as PCP - General (Family Medicine) Chrismon, Vickki Muff, PA-C (Inactive) (Physician Assistant) Cammie Sickle, MD as Consulting Physician (Oncology)  CHIEF COMPLAINTS/PURPOSE OF CONSULTATION: ANEMIA  HEMATOLOGY HISTORY  # ANEMIA[Hb; MCV-platelets- WBC; Iron sat; ferritin;  GFR- CT/US- ;  EGD/colonoscopy-none  Menorrhagia:  not on BCPs. [Dr.Harris; but new [facility]]   Latest Reference Range & Units 06/04/22 17:40  WBC 4.0 - 10.5 K/uL 6.6  RBC 3.87 - 5.11 MIL/uL 4.91  Hemoglobin 12.0 - 15.0 g/dL 8.4 (L)  HCT 36.0 - 46.0 % 30.3 (L)  MCV 80.0 - 100.0 fL 61.7 (L)  MCH 26.0 - 34.0 pg 17.1 (L)  MCHC 30.0 - 36.0 g/dL 27.7 (L)  RDW 11.5 - 15.5 % 20.9 (H)  Platelets 150 - 400 K/uL 329  nRBC 0.0 - 0.2 % 0.0  (L): Data is abnormally low (H): Data is abnormally high   Latest Reference Range & Units 01/03/17 08:52  Iron 27 - 159 ug/dL 57  UIBC 131 - 425 ug/dL 395  TIBC 250 - 450 ug/dL 452 (H)  Iron Saturation 15 - 55 % 13 (L)  (H): Data is abnormally high (L): Data is abnormally low  HISTORY OF PRESENTING ILLNESS: Alone.  Ambulating independently.  Michelle Hardy 41 y.o.  female pleasant patient was been referred to [place] for further evaluation of anemia.  Was recently evaluated in the emergency room for shortness of breath noted to have asthmatic exacerbation.  Patient also noted to have severe anemia hemoglobin around 7.  Blood in stools: none Blood in urine:none Difficulty swallowing:none Change of bowel movement/constipation: none Prior blood transfusion: none Liver disease: none Alcohol:  none Bariatric surgery:none  Vaginal bleeding: very heavy; not on BCPs. [Dr.Harris; but [facility]] Prior evaluation with hematology: none Prior bone marrow biopsy: none Oral iron: on OTC- 2 weeks/ month Prior IV iron infusions: none.    Review of Systems  Constitutional:  Positive for  malaise/fatigue. Negative for chills, diaphoresis, fever and weight loss.  HENT:  Negative for nosebleeds and sore throat.   Eyes:  Negative for double vision.  Respiratory:  Positive for cough and shortness of breath. Negative for hemoptysis, sputum production and wheezing.   Cardiovascular:  Negative for chest pain, palpitations, orthopnea and leg swelling.  Gastrointestinal:  Negative for abdominal pain, blood in stool, constipation, diarrhea, heartburn, melena, nausea and vomiting.  Genitourinary:  Negative for dysuria, frequency and urgency.  Musculoskeletal:  Negative for back pain and joint pain.  Skin: Negative.  Negative for itching and rash.  Neurological:  Positive for dizziness. Negative for tingling, focal weakness, weakness and headaches.  Endo/Heme/Allergies:  Does not bruise/bleed easily.  Psychiatric/Behavioral:  Negative for depression. The patient is not nervous/anxious and does not have insomnia.      MEDICAL HISTORY:  Past Medical History:  Diagnosis Date   Anemia    Asthma    Family history of adverse reaction to anesthesia    mother has problems with blood pressure and difficulty staying asleep during surgery   Headache    History of uterine fibroid    PONV (postoperative nausea and vomiting)     SURGICAL HISTORY: Past Surgical History:  Procedure Laterality Date   LAPAROTOMY N/A 02/06/2017   Procedure: LAPAROTOMY;  Surgeon: Gae Dry, MD;  Location: [facility] ORS;  Service: Gynecology;  Laterality: N/A;   MYOMECTOMY  2011   MYOMECTOMY N/A 02/06/2017  Procedure: MYOMECTOMY;  Surgeon: Gae Dry, MD;  Location: [facility] ORS;  Service: Gynecology;  Laterality: N/A;   UTERINE FIBROID SURGERY      SOCIAL HISTORY: Social History   Socioeconomic History   Marital status: Single    Spouse name: Not on file   Number of children: Not on file   Years of education: Not on file   Highest education level: Not on file  Occupational History   Not on file   Tobacco Use   Smoking status: Never    Passive exposure: Never   Smokeless tobacco: Never  Vaping Use   Vaping Use: Never used  Substance and Sexual Activity   Alcohol use: Not Currently   Drug use: Never   Sexual activity: Yes    Birth control/protection: None  Other Topics Concern   Not on file  Social History Narrative   Lives in [place]; self; 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    Social Determinants of Health   Financial Resource Strain: Not on file  Food Insecurity: Not on file  Transportation Needs: Not on file  Physical Activity: Not on file  Stress: Not on file  Social Connections: Not on file  Intimate Partner Violence: Not on file    FAMILY HISTORY: Family History  Problem Relation Age of Onset   Fibromyalgia Mother    Stroke Mother    Diabetes Father    Arthritis Father    Tongue cancer Maternal Uncle    Tongue cancer Maternal Uncle    Tongue cancer Maternal Uncle    Bone cancer Maternal Grandfather     ALLERGIES:  is allergic to coconut (cocos nucifera) and coconut fatty acids.  MEDICATIONS:  Current Outpatient Medications  Medication Sig Dispense Refill   albuterol (VENTOLIN HFA) 108 (90 Base) MCG/ACT inhaler Inhale 2 puffs into the lungs every 6 (six) hours as needed for wheezing or shortness of breath. 8 g 2   fluticasone (FLONASE) 50 MCG/ACT nasal spray Place 2 sprays into both nostrils daily. 16 g 6   guaiFENesin-codeine 100-10 MG/5ML syrup Take 5 mLs by mouth 3 (three) times daily as needed for cough. 120 mL 0   ibuprofen (ADVIL) 800 MG tablet Take 1 tablet (800 mg total) by mouth every 8 (eight) hours as needed. 30 tablet 0   ipratropium-albuterol (DUONEB) 0.5-2.5 (3) MG/3ML SOLN Take 3 mLs by nebulization every 4 (four) hours as needed (cough, wheezing). 360 mL 0   Nebulizer System All-In-One MISC 1 each by Does not apply route as needed (cough). 1 each 0   IRON PO Take 1 tablet by mouth daily.     No current  facility-administered medications for this visit.     Marland Kitchen  PHYSICAL EXAMINATION:   Vitals:   06/19/22 1358  BP: (!) 124/90  Pulse: (!) 115  Temp: (!) 97 F (36.1 C)  SpO2: 100%   Filed Weights   06/19/22 1358  Weight: 186 lb 6.4 oz (84.6 kg)   Appears pale. Physical Exam Vitals and nursing note reviewed.  HENT:     Head: Normocephalic and atraumatic.     Mouth/Throat:     Pharynx: Oropharynx is clear.  Eyes:     Extraocular Movements: Extraocular movements intact.     Pupils: Pupils are equal, round, and reactive to light.  Cardiovascular:     Rate and Rhythm: Normal rate and regular rhythm.  Pulmonary:     Comments: Decreased breath sounds bilaterally.  Abdominal:  Palpations: Abdomen is soft.  Musculoskeletal:        General: Normal range of motion.     Cervical back: Normal range of motion.  Skin:    General: Skin is warm.  Neurological:     General: No focal deficit present.     Mental Status: She is alert and oriented to person, place, and time.  Psychiatric:        Behavior: Behavior normal.        Judgment: Judgment normal.      LABORATORY DATA:  I have reviewed the data as listed Lab Results  Component Value Date   WBC 7.5 06/19/2022   HGB 7.8 (L) 06/19/2022   HCT 27.8 (L) 06/19/2022   MCV 61.9 (L) 06/19/2022   PLT 401 (H) 06/19/2022   Recent Labs    07/19/21 2016 07/19/21 2036 06/04/22 1740 06/19/22 1437  NA 134* 136 137 137  K 3.7 4.0 3.7 3.4*  CL 106 107 107 104  CO2 21*  --  24 25  GLUCOSE 104* 109* 101* 118*  BUN '7 7 10 9  ' CREATININE 0.83 0.70 0.65 0.76  CALCIUM 8.9  --  9.4 9.0  GFRNONAA >60  --  >60 >60  PROT 7.4  --  8.6* 7.3  ALBUMIN 3.7  --  4.4 3.7  AST 32  --  18 16  ALT 21  --  15 17  ALKPHOS 52  --  59 53  BILITOT 0.5  --  0.5 0.3     CT Angio Chest PE W and/or Wo Contrast  Result Date: 06/04/2022 CLINICAL DATA:  Shortness of breath.  Chest pain and tachycardia. EXAM: CT ANGIOGRAPHY CHEST WITH CONTRAST  TECHNIQUE: Multidetector CT imaging of the chest was performed using the standard protocol during bolus administration of intravenous contrast. Multiplanar CT image reconstructions and MIPs were obtained to evaluate the vascular anatomy. RADIATION DOSE REDUCTION: This exam was performed according to the departmental dose-optimization program which includes automated exposure control, adjustment of the mA and/or kV according to patient size and/or use of iterative reconstruction technique. CONTRAST:  92m OMNIPAQUE IOHEXOL 350 MG/ML SOLN COMPARISON:  07/19/2021 CT from . FINDINGS: Cardiovascular: The quality of this exam for evaluation of pulmonary embolism is moderate. The bolus is centered in the SVC. No pulmonary embolism to the segmental level. Normal caliber of the aorta and branch vessels. Normal heart size, without pericardial effusion. Mediastinum/Nodes: No mediastinal or hilar adenopathy. Tiny hiatal hernia. Lungs/Pleura: No pleural fluid.  Clear lungs. Upper Abdomen: Subcentimeter high right hepatic lobe cyst. Normal imaged portions of the spleen, pancreas, adrenal glands, kidneys. Musculoskeletal: Remote posterior lower left rib fractures. Review of the MIP images confirms the above findings. IMPRESSION: 1. No evidence of pulmonary embolism. 2. No acute process in the chest. 3. Tiny hiatal hernia. Electronically Signed   By: KAbigail MiyamotoM.D.   On: 06/04/2022 18:34    ASSESSMENT & PLAN:   Symptomatic anemia #Severe anemia-Likely due to iron deficiency hemoglobin 7-8.  Symptomatic.  # Discussed regarding IV iron infusion/Venofer. Discussed the potential acute infusion reactions with IV iron; which are quite rare.  Patient understands the risk; will proceed with infusions.   #Etiology of iron deficiency: Likely secondary menorrhagia; recommend follow-up with gynecology regarding BCPs to control menstruation.  # Hx of asthma- recent exacerbation [s/p Dr.Aron Thompson]; awaiting  nebs  Thank you Dr. TGrandville Silosfor allowing me to participate in the care of your pleasant patient. Please do not hesitate to  contact me with questions or concerns in the interim.   # DISPOSITION: # labs today # venofer weekly x 4 # follow up in 6 weeks MD: labs- cbc;possible venofer-Dr.B    All questions were answered. The patient knows to call the clinic with any problems, questions or concerns.    Cammie Sickle, MD 06/21/2022 7:37 PM

## 2022-06-19 NOTE — Assessment & Plan Note (Addendum)
#  Severe anemia-Likely due to iron deficiency hemoglobin 7-8.  Symptomatic.  # Discussed regarding IV iron infusion/Venofer. Discussed the potential acute infusion reactions with IV iron; which are quite rare.  Patient understands the risk; will proceed with infusions.   #Etiology of iron deficiency: Likely secondary menorrhagia; recommend follow-up with gynecology regarding BCPs to control menstruation.  # Hx of asthma- recent exacerbation [s/p Dr.Aron Thompson]; awaiting nebs  Thank you Dr. Grandville Silos for allowing me to participate in the care of your pleasant patient. Please do not hesitate to contact me with questions or concerns in the interim.   # DISPOSITION: # labs today # venofer weekly x 4 # follow up in 6 weeks MD: labs- cbc;possible venofer-Dr.B

## 2022-06-20 LAB — HAPTOGLOBIN: Haptoglobin: 153 mg/dL (ref 42–296)

## 2022-06-21 ENCOUNTER — Encounter: Payer: Self-pay | Admitting: Obstetrics and Gynecology

## 2022-06-21 ENCOUNTER — Ambulatory Visit: Payer: BC Managed Care – PPO | Admitting: Obstetrics and Gynecology

## 2022-06-21 ENCOUNTER — Encounter: Payer: Self-pay | Admitting: Internal Medicine

## 2022-06-21 VITALS — BP 116/80 | Ht 67.0 in | Wt 186.2 lb

## 2022-06-21 DIAGNOSIS — N921 Excessive and frequent menstruation with irregular cycle: Secondary | ICD-10-CM | POA: Diagnosis not present

## 2022-06-21 DIAGNOSIS — D219 Benign neoplasm of connective and other soft tissue, unspecified: Secondary | ICD-10-CM | POA: Diagnosis not present

## 2022-06-21 DIAGNOSIS — Z7689 Persons encountering health services in other specified circumstances: Secondary | ICD-10-CM | POA: Diagnosis not present
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
## 2022-06-21 NOTE — Progress Notes (Signed)
Patient presents today to discuss anemia and history of fibroids. She states a history of fibroids with two myomectomy's, most recent CT 07/19/21 showed fibroid uterus. She take iron supplements for anemia, hematologist is starting transfusion in the next week. Patient currently has regular cycles that are heavy with severe cramping. No other concerns at this time.

## 2022-06-22 ENCOUNTER — Encounter: Payer: Self-pay | Admitting: Family Medicine

## 2022-06-22 DIAGNOSIS — J4541 Moderate persistent asthma with (acute) exacerbation: Secondary | ICD-10-CM

## 2022-06-27 ENCOUNTER — Inpatient Hospital Stay: Payer: BC Managed Care – PPO

## 2022-06-27 VITALS — BP 119/72 | HR 96 | Temp 98.5°F | Resp 16

## 2022-06-27 DIAGNOSIS — Z808 Family history of malignant neoplasm of other organs or systems: Secondary | ICD-10-CM | POA: Diagnosis not present

## 2022-06-27 DIAGNOSIS — N920 Excessive and frequent menstruation with regular cycle: Secondary | ICD-10-CM | POA: Diagnosis not present

## 2022-06-27 DIAGNOSIS — D649 Anemia, unspecified: Secondary | ICD-10-CM | POA: Diagnosis not present

## 2022-06-27 DIAGNOSIS — J45909 Unspecified asthma, uncomplicated: Secondary | ICD-10-CM | POA: Diagnosis not present

## 2022-06-27 MED ORDER — DIPHENHYDRAMINE HCL 25 MG PO CAPS
25.0000 mg | ORAL_CAPSULE | Freq: Once | ORAL | Status: AC
  Administered 2022-06-27: 25 mg via ORAL

## 2022-06-27 MED ORDER — SODIUM CHLORIDE 0.9 % IV SOLN
Freq: Once | INTRAVENOUS | Status: AC
  Filled 2022-06-27: qty 250

## 2022-06-27 MED ORDER — SODIUM CHLORIDE 0.9 % IV SOLN
200.0000 mg | Freq: Once | INTRAVENOUS | Status: AC
  Administered 2022-06-27: 200 mg via INTRAVENOUS
  Filled 2022-06-27: qty 200

## 2022-06-27 MED ORDER — DIPHENHYDRAMINE HCL 25 MG PO CAPS
ORAL_CAPSULE | ORAL | Status: AC
  Filled 2022-06-27: qty 1

## 2022-06-27 NOTE — Progress Notes (Signed)
At 1555, during the observation period after Venofer. Patient began complaining of mild itching on her lower back and feet. No rash noted. No shortness of breath or chest pain present. Vital signs stable.  Dr. Tish Men made aware. Ordered 25 mg of Benadryl oral. Informed to monitor patient for 15 minutes.  Per Dr. Rogue Bussing, patient should receive 25 mg of Benadryl prior to each Venofer infusion and that he will add it to her treatment plan. Patient informed she will be taking Benadryl as a premed and she verbalizes understanding.  At 1632, Vital signs stable discharge. Patient stated, " My itching has improved." I informed patient if itching gets worse or any other symptoms occur to go to ED.  Patient verbalizes understanding. Patient sent home.

## 2022-06-27 NOTE — Patient Instructions (Signed)

## 2022-07-04 ENCOUNTER — Ambulatory Visit: Payer: BC Managed Care – PPO

## 2022-07-04 ENCOUNTER — Other Ambulatory Visit: Payer: Self-pay | Admitting: Medical Oncology

## 2022-07-04 ENCOUNTER — Inpatient Hospital Stay: Payer: BC Managed Care – PPO

## 2022-07-04 VITALS — BP 118/85 | HR 86 | Temp 98.3°F | Resp 18

## 2022-07-04 DIAGNOSIS — N920 Excessive and frequent menstruation with regular cycle: Secondary | ICD-10-CM | POA: Diagnosis not present

## 2022-07-04 DIAGNOSIS — D649 Anemia, unspecified: Secondary | ICD-10-CM | POA: Diagnosis not present

## 2022-07-04 DIAGNOSIS — D219 Benign neoplasm of connective and other soft tissue, unspecified: Secondary | ICD-10-CM

## 2022-07-04 DIAGNOSIS — Z808 Family history of malignant neoplasm of other organs or systems: Secondary | ICD-10-CM | POA: Diagnosis not present

## 2022-07-04 DIAGNOSIS — J45909 Unspecified asthma, uncomplicated: Secondary | ICD-10-CM | POA: Diagnosis not present

## 2022-07-04 MED ORDER — SODIUM CHLORIDE 0.9 % IV SOLN
Freq: Once | INTRAVENOUS | Status: AC
  Filled 2022-07-04: qty 250

## 2022-07-04 MED ORDER — SODIUM CHLORIDE 0.9 % IV SOLN
200.0000 mg | Freq: Once | INTRAVENOUS | Status: AC
  Administered 2022-07-04: 200 mg via INTRAVENOUS
  Filled 2022-07-04: qty 200

## 2022-07-11 ENCOUNTER — Inpatient Hospital Stay: Payer: BC Managed Care – PPO

## 2022-07-11 ENCOUNTER — Ambulatory Visit: Payer: BC Managed Care – PPO | Admitting: Family Medicine

## 2022-07-11 VITALS — BP 118/69 | HR 76 | Temp 98.3°F | Resp 16

## 2022-07-11 DIAGNOSIS — D649 Anemia, unspecified: Secondary | ICD-10-CM | POA: Diagnosis not present

## 2022-07-11 DIAGNOSIS — Z808 Family history of malignant neoplasm of other organs or systems: Secondary | ICD-10-CM | POA: Diagnosis not present

## 2022-07-11 DIAGNOSIS — J45909 Unspecified asthma, uncomplicated: Secondary | ICD-10-CM | POA: Diagnosis not present

## 2022-07-11 DIAGNOSIS — N920 Excessive and frequent menstruation with regular cycle: Secondary | ICD-10-CM | POA: Diagnosis not present

## 2022-07-11 MED ORDER — SODIUM CHLORIDE 0.9 % IV SOLN
200.0000 mg | Freq: Once | INTRAVENOUS | Status: AC
  Administered 2022-07-11: 200 mg via INTRAVENOUS
  Filled 2022-07-11: qty 200

## 2022-07-11 MED ORDER — SODIUM CHLORIDE 0.9 % IV SOLN
Freq: Once | INTRAVENOUS | Status: AC
  Filled 2022-07-11: qty 250

## 2022-07-11 NOTE — Progress Notes (Signed)
Pt reported that she had itching with first two venofer infusions. She took benadryl '25mg'$  prior to appointment today. Tolerated infusion well. Reports very mild itching at completion of infusion.

## 2022-07-11 NOTE — Patient Instructions (Signed)

## 2022-07-18 ENCOUNTER — Inpatient Hospital Stay: Payer: BC Managed Care – PPO | Attending: Internal Medicine

## 2022-07-18 VITALS — BP 114/80 | HR 80 | Resp 18

## 2022-07-18 DIAGNOSIS — D509 Iron deficiency anemia, unspecified: Secondary | ICD-10-CM | POA: Insufficient documentation

## 2022-07-18 DIAGNOSIS — N920 Excessive and frequent menstruation with regular cycle: Secondary | ICD-10-CM | POA: Insufficient documentation

## 2022-07-18 DIAGNOSIS — D649 Anemia, unspecified: Secondary | ICD-10-CM

## 2022-07-18 MED ORDER — SODIUM CHLORIDE 0.9 % IV SOLN
200.0000 mg | Freq: Once | INTRAVENOUS | Status: AC
  Administered 2022-07-18: 200 mg via INTRAVENOUS
  Filled 2022-07-18: qty 200

## 2022-07-18 MED ORDER — SODIUM CHLORIDE 0.9 % IV SOLN
Freq: Once | INTRAVENOUS | Status: AC
  Filled 2022-07-18: qty 250

## 2022-07-22 ENCOUNTER — Ambulatory Visit: Payer: BC Managed Care – PPO | Admitting: Neurology

## 2022-07-22 ENCOUNTER — Encounter: Payer: Self-pay | Admitting: Neurology

## 2022-07-22 VITALS — BP 112/71 | HR 91 | Ht 67.0 in | Wt 186.2 lb

## 2022-07-22 DIAGNOSIS — G43001 Migraine without aura, not intractable, with status migrainosus: Secondary | ICD-10-CM | POA: Insufficient documentation

## 2022-07-22 DIAGNOSIS — G43709 Chronic migraine without aura, not intractable, without status migrainosus: Secondary | ICD-10-CM | POA: Insufficient documentation

## 2022-07-22 DIAGNOSIS — R5383 Other fatigue: Secondary | ICD-10-CM | POA: Diagnosis not present

## 2022-07-22 MED ORDER — RIZATRIPTAN BENZOATE 10 MG PO TBDP: 10.0000 mg | ORAL_TABLET | ORAL | 11 refills | Status: DC | PRN

## 2022-07-22 MED ORDER — UBRELVY 100 MG PO TABS: 100.0000 mg | ORAL_TABLET | ORAL | 0 refills | Status: DC | PRN

## 2022-07-22 MED ORDER — QULIPTA 60 MG PO TABS: 60.0000 mg | ORAL_TABLET | Freq: Every day | ORAL | 11 refills | Status: DC

## 2022-07-22 NOTE — Patient Instructions (Addendum)
Preventive: Take qulipta for a month. Daily. If migraines improve to less that 8 a month and less than 10 total headache days a month then we can prescribe it to a place called [facility] with ubrelvy. As needed: Maxalt and/or Ubrelvy. Please take one tablet at the onset of your headache. If it does not improve the symptoms please take one additional tablet in 2 hours. Do not take more then 2 tablets in 24hrs.  Other options Ajovy, Emgality for prevention and nurtec for acute/as needed - these are all "new" migraine medications (nurtec) Mychart me in 3-4 weeks or sooner  Atogepant Tablets What is this medication? ATOGEPANT (a TOE je pant) prevents migraines. It works by blocking a substance in the body that causes migraines. This medicine may be used for other purposes; ask your health care provider or pharmacist if you have questions. COMMON BRAND NAME(S): QULIPTA What should I tell my care team before I take this medication? They need to know if you have any of these conditions: Kidney disease Liver disease An unusual or allergic reaction to atogepant, other medications, foods, dyes, or preservatives Pregnant or trying to get pregnant Breast-feeding How should I use this medication? Take this medication by mouth with water. Take it as directed on the prescription label at the same time every day. You can take it with or without food. If it upsets your stomach, take it with food. Keep taking it unless your care team tells you to stop. Talk to your care team about the use of this medication in children. Special care may be needed. Overdosage: If you think you have taken too much of this medicine contact a poison control center or emergency room at once. NOTE: This medicine is only for you. Do not share this medicine with others. What if I miss a dose? If you miss a dose, take it as soon as you can. If it is almost time for your next dose, take only that dose. Do not take double or extra  doses. What may interact with this medication? Carbamazepine Certain medications for fungal infections, such as itraconazole, ketoconazole Clarithromycin Cyclosporine Efavirenz Etravirine Phenytoin Rifampin St. John's wort This list may not describe all possible interactions. Give your health care provider a list of all the medicines, herbs, non-prescription drugs, or dietary supplements you use. Also tell them if you smoke, drink alcohol, or use illegal drugs. Some items may interact with your medicine. What should I watch for while using this medication? Visit your care team for regular checks on your progress. Tell your care team if your symptoms do not start to get better or if they get worse. What side effects may I notice from receiving this medication? Side effects that you should report to your care team as soon as possible: Allergic reactions--skin rash, itching, hives, swelling of the face, lips, tongue, or throat Side effects that usually do not require medical attention (report to your care team if they continue or are bothersome): Constipation Fatigue Loss of appetite with weight loss Nausea This list may not describe all possible side effects. Call your doctor for medical advice about side effects. You may report side effects to FDA at 1-800-FDA-1088. Where should I keep my medication? Keep out of the reach of children and pets. Store at room temperature between 20 and 25 degrees C (68 and 77 degrees F). Get rid of any unused medication after the expiration date. To get rid of medications that are no longer needed or have  expired: Take the medication to a medication take-back program. Check with your pharmacy or law enforcement to find a location. If you cannot return the medication, check the label or package insert to see if the medication should be thrown out in the garbage or flushed down the toilet. If you are not sure, ask your care team. If it is safe to put it in the  trash, take the medication out of the container. Mix the medication with cat litter, dirt, coffee grounds, or other unwanted substance. Seal the mixture in a bag or container. Put it in the trash. NOTE: This sheet is a summary. It may not cover all possible information. If you have questions about this medicine, talk to your doctor, pharmacist, or health care provider.  2023 Elsevier/Gold Standard (2021-12-17 00:00:00) Ubrogepant Tablets What is this medication? UBROGEPANT (ue BROE je pant) treats migraines. It works by blocking a substance in the body that causes migraines. It is not used to prevent migraines. This medicine may be used for other purposes; ask your health care provider or pharmacist if you have questions. COMMON BRAND NAME(S): Roselyn Meier What should I tell my care team before I take this medication? They need to know if you have any of these conditions: Kidney disease Liver disease An unusual or allergic reaction to ubrogepant, other medications, foods, dyes, or preservatives Pregnant or trying to get pregnant Breast-feeding How should I use this medication? Take this medication by mouth with a glass of water. Take it as directed on the prescription label. You can take it with or without food. If it upsets your stomach, take it with food. Keep taking it unless your care team tells you to stop. Talk to your care team about the use of this medication in children. Special care may be needed. Overdosage: If you think you have taken too much of this medicine contact a poison control center or emergency room at once. NOTE: This medicine is only for you. Do not share this medicine with others. What if I miss a dose? This does not apply. This medication is not for regular use. What may interact with this medication? Do not take this medication with any of the following: Adagrasib Ceritinib Certain antibiotics, such as chloramphenicol, clarithromycin, telithromycin Certain antivirals  for HIV, such as atazanavir, cobicistat, darunavir, delavirdine, fosamprenavir, indinavir, ritonavir Certain medications for fungal infections, such as itraconazole, ketoconazole, posaconazole, voriconazole Conivaptan Grapefruit Idelalisib Mifepristone Nefazodone Ribociclib This medication may also interact with the following: Carvedilol Certain medications for seizures, such as phenobarbital, phenytoin Ciprofloxacin Cyclosporine Eltrombopag Fluconazole Fluvoxamine Quinidine Rifampin St. John's wort Verapamil This list may not describe all possible interactions. Give your health care provider a list of all the medicines, herbs, non-prescription drugs, or dietary supplements you use. Also tell them if you smoke, drink alcohol, or use illegal drugs. Some items may interact with your medicine. What should I watch for while using this medication? Visit your care team for regular checks on your progress. Tell your care team if your symptoms do not start to get better or if they get worse. Your mouth may get dry. Chewing sugarless gum or sucking hard candy and drinking plenty of water may help. Contact your care team if the problem does not go away or is severe. What side effects may I notice from receiving this medication? Side effects that you should report to your care team as soon as possible: Allergic reactions--skin rash, itching, hives, swelling of the face, lips, tongue, or throat Side  effects that usually do not require medical attention (report to your care team if they continue or are bothersome): Drowsiness Dry mouth Fatigue Nausea This list may not describe all possible side effects. Call your doctor for medical advice about side effects. You may report side effects to FDA at 1-800-FDA-1088. Where should I keep my medication? Keep out of the reach of children and pets. Store between 15 and 30 degrees C (59 and 86 degrees F). Get rid of any unused medication after the  expiration date. To get rid of medications that are no longer needed or have expired: Take the medication to a medication take-back program. Check with your pharmacy or law enforcement to find a location. If you cannot return the medication, check the label or package insert to see if the medication should be thrown out in the garbage or flushed down the toilet. If you are not sure, ask your care team. If it is safe to put it in the trash, pour the medication out of the container. Mix the medication with cat litter, dirt, coffee grounds, or other unwanted substance. Seal the mixture in a bag or container. Put it in the trash. NOTE: This sheet is a summary. It may not cover all possible information. If you have questions about this medicine, talk to your doctor, pharmacist, or health care provider.  2023 Elsevier/Gold Standard (2021-11-09 00:00:00) Rizatriptan Tablets What is this medication? RIZATRIPTAN (rye za TRIP tan) treats migraines. It works by blocking pain signals and narrowing blood vessels in the brain. It belongs to a group of medications called triptans. It is not used to prevent migraines. This medicine may be used for other purposes; ask your health care provider or pharmacist if you have questions. COMMON BRAND NAME(S): Maxalt What should I tell my care team before I take this medication? They need to know if you have any of these conditions: Circulation problems in fingers and toes Diabetes Heart disease High blood pressure High cholesterol History of irregular heartbeat History of stroke Stomach or intestine problems Tobacco use An unusual or allergic reaction to rizatriptan, other medications, foods, dyes, or preservatives Pregnant or trying to get pregnant Breast-feeding How should I use this medication? Take this medication by mouth with water. Take it as directed on the prescription label. Do not use it more often than directed. Talk to your care team about the use of  this medication in children. While it may be prescribed for children as young as 6 years for selected conditions, precautions do apply. Overdosage: If you think you have taken too much of this medicine contact a poison control center or emergency room at once. NOTE: This medicine is only for you. Do not share this medicine with others. What if I miss a dose? This does not apply. This medication is not for regular use. What may interact with this medication? Do not take this medication with any of the following: Ergot alkaloids, such as dihydroergotamine, ergotamine MAOIs, such as Marplan, Nardil, Parnate Other medications for migraine headache, such as almotriptan, eletriptan, frovatriptan, naratriptan, sumatriptan, zolmitriptan This medication may also interact with the following: Certain medications for depression, anxiety, or other mental health conditions Propranolol This list may not describe all possible interactions. Give your health care provider a list of all the medicines, herbs, non-prescription drugs, or dietary supplements you use. Also tell them if you smoke, drink alcohol, or use illegal drugs. Some items may interact with your medicine. What should I watch for while using this  medication? Visit your care team for regular checks on your progress. Tell your care team if your symptoms do not start to get better or if they get worse. This medication may affect your coordination, reaction time, or judgment. Do not drive or operate machinery until you know how this medication affects you. Sit up or stand slowly to reduce the risk of dizzy or fainting spells. If you take migraine medications for 10 or more days a month, your migraines may get worse. Keep a diary of headache days and medication use. Contact your care team if your migraine attacks occur more frequently. What side effects may I notice from receiving this medication? Side effects that you should report to your care team as  soon as possible: Allergic reactions--skin rash, itching, hives, swelling of the face, lips, tongue, or throat Burning, pain, tingling, or color changes in the hands, arms, legs, or feet Heart attack--pain or tightness in the chest, shoulders, arms, or jaw, nausea, shortness of breath, cold or clammy skin, feeling faint or lightheaded Heart rhythm changes--fast or irregular heartbeat, dizziness, feeling faint or lightheaded, chest pain, trouble breathing Increase in blood pressure Irritability, confusion, fast or irregular heartbeat, muscle stiffness, twitching muscles, sweating, high fever, seizure, chills, vomiting, diarrhea, which may be signs of serotonin syndrome Raynaud syndrome--cool, numb, or painful fingers or toes that may change color from pale, to blue, to red Seizures Stroke--sudden numbness or weakness of the face, arm, or leg, trouble speaking, confusion, trouble walking, loss of balance or coordination, dizziness, severe headache, change in vision Sudden or severe stomach pain, bloody diarrhea, fever, nausea, vomiting Vision loss Side effects that usually do not require medical attention (report to your care team if they continue or are bothersome): Dizziness Unusual weakness or fatigue This list may not describe all possible side effects. Call your doctor for medical advice about side effects. You may report side effects to FDA at 1-800-FDA-1088. Where should I keep my medication? Keep out of the reach of children and pets. Store at room temperature between 15 and 30 degrees C (59 and 86 degrees F). Get rid of any unused medication after the expiration date. To get rid of medications that are no longer needed or have expired: Take the medication to a medication take-back program. Check with your pharmacy or law enforcement to find a location. If you cannot return the medication, check the label or package insert to see if the medication should be thrown out in the garbage or  flushed down the toilet. If you are not sure, ask your care team. If it is safe to put it in the trash, empty the medication out of the container. Mix the medication with cat litter, dirt, coffee grounds, or other unwanted substance. Seal the mixture in a bag or container. Put it in the trash. NOTE: This sheet is a summary. It may not cover all possible information. If you have questions about this medicine, talk to your doctor, pharmacist, or health care provider.  2023 Elsevier/Gold Standard (2022-02-28 00:00:00)    Analgesic Rebound Headache An analgesic rebound headache, sometimes called a medication overuse headache or a drug-induced headache, is a secondary disorder that is caused by the overuse of pain medicine (analgesic) to treat the original (primary) headache. Any type of primary headache can return as a rebound headache if a person regularly takes analgesics. The types of primary headaches that are commonly associated with rebound headaches include: Migraines. Headaches that are caused by tense muscles in the head  and neck area (tension headaches). Headaches that develop and happen again on one side of the head and around the eye (cluster headaches). If rebound headaches continue, they can become long-term, daily headaches. What are the causes? This condition may be caused by frequent use of: Over-the-counter medicines such as aspirin, ibuprofen, and acetaminophen. Sinus-relief medicines and medicines that contain caffeine. Narcotic pain medicines such as codeine and oxycodone. Some prescription migraine medicines. What are the signs or symptoms? The symptoms of a rebound headache are the same as the symptoms of the original headache. Some of the symptoms of specific types of headaches include: Migraine headache Pulsing or throbbing pain on one or both sides of the head. Severe pain that interferes with daily activities. Pain that gets worse with physical activity. Nausea,  vomiting, or both. Pain and sensitivity with exposure to bright light, loud noises, or strong smells. Visual changes. Numbness of one or both arms. Tension headache Pressure around the head. Dull, aching head pain. Pain felt over the front and sides of the head. Tenderness in the muscles of the head, neck, and shoulders. Cluster headache Severe pain that begins in or around one eye or temple. Droopy or swollen eyelid, or redness and tearing in the eye on the same side as the pain. One-sided head pain. Nausea. Runny nose. Sweaty, pale facial skin. Restlessness. How is this diagnosed? This condition is diagnosed by: Reviewing your medical history. This includes the nature of your primary headaches. Reviewing the types of pain medicines that you have been using to treat your primary headaches and how often you take them. How is this treated? This condition may be treated or managed by: Discontinuing frequent use of the analgesic medicine. Doing this may worsen your headaches at first, but the pain should eventually become more manageable, less frequent, and less severe. Seeing a headache specialist. He or she may be able to help you manage your headaches and help make sure there is not another cause of the headaches. Using methods of stress relief, such as acupuncture, counseling, biofeedback, and massage. Talk with your health care provider about which methods might be good for you. Follow these instructions at home: Medicines  Take over-the-counter and prescription medicines only as told by your health care provider. Stop the repeated use of pain medicine as told by your health care provider. Stopping can be difficult. Carefully follow instructions from your health care provider. Lifestyle  Follow a regular sleep schedule. Do not vary the time that you go to bed or the amount that you sleep from day to day. It is important to stay on the same schedule to help prevent headaches. Get  7-9 hours of sleep each night, or the amount recommended by your health care provider. Exercise regularly. Exercise for at least 30 minutes, 5 times each week. Limit or manage stress. Consider stress-relief options such as acupuncture, counseling, biofeedback, and massage. Talk with your health care provider about which methods might be good for you. Do not drink alcohol. Do not use any products that contain nicotine or tobacco, such as cigarettes, e-cigarettes, and chewing tobacco. If you need help quitting, ask your health care provider. General instructions Avoid triggers that are known to cause your primary headaches. Keep all follow-up visits as told by your health care provider. This is important. Contact a health care provider if: You continue to experience headaches after following treatments that your health care provider recommended. Get help right away if you have: New headache pain. Headache pain  that is different than what you have experienced in the past. Numbness or tingling in your arms or legs. Changes in your speech or vision. Summary An analgesic rebound headache, sometimes called a medication overuse headache or a drug-induced headache, is caused by the overuse of pain medicine (analgesic) to treat the original (primary) headache. Any type of primary headache can return as a rebound headache if a person regularly takes analgesics. The types of primary headaches that are commonly associated with rebound headaches include migraines, tension headaches, and cluster headaches. Analgesic rebound headaches can occur with frequent use of over-the-counter medicines and prescription medicines. Treatment involves stopping the medicine that is being overused. This will improve headache frequency and severity. This information is not intended to replace advice given to you by your health care provider. Make sure you discuss any questions you have with your health care provider. Document  Revised: 11/25/2019 Document Reviewed: 11/25/2019 Else[employer] Patient Education  [place].
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
## 2022-07-23 DIAGNOSIS — L818 Other specified disorders of pigmentation: Secondary | ICD-10-CM | POA: Diagnosis not present

## 2022-07-23 DIAGNOSIS — L700 Acne vulgaris: Secondary | ICD-10-CM | POA: Diagnosis not present

## 2022-07-23 DIAGNOSIS — L2089 Other atopic dermatitis: Secondary | ICD-10-CM | POA: Diagnosis not present

## 2022-07-23 LAB — TSH RFX ON ABNORMAL TO FREE T4: TSH: 2.25 u[IU]/mL (ref 0.450–4.500)

## 2022-07-25 ENCOUNTER — Ambulatory Visit (INDEPENDENT_AMBULATORY_CARE_PROVIDER_SITE_OTHER): Payer: BC Managed Care – PPO

## 2022-07-25 DIAGNOSIS — N920 Excessive and frequent menstruation with regular cycle: Secondary | ICD-10-CM | POA: Diagnosis not present

## 2022-07-25 DIAGNOSIS — Z8742 Personal history of other diseases of the female genital tract: Secondary | ICD-10-CM | POA: Diagnosis not present

## 2022-07-31 ENCOUNTER — Ambulatory Visit: Payer: BC Managed Care – PPO | Admitting: Internal Medicine

## 2022-07-31 ENCOUNTER — Ambulatory Visit: Payer: BC Managed Care – PPO

## 2022-07-31 ENCOUNTER — Other Ambulatory Visit: Payer: BC Managed Care – PPO

## 2022-08-04 NOTE — Progress Notes (Signed)
Based on these results, please have her schedule a visit with me.  May do video visit if desired.

## 2022-08-20 ENCOUNTER — Encounter: Payer: Self-pay | Admitting: Internal Medicine

## 2022-08-20 ENCOUNTER — Inpatient Hospital Stay: Payer: BC Managed Care – PPO

## 2022-08-20 ENCOUNTER — Inpatient Hospital Stay: Payer: BC Managed Care – PPO | Attending: Internal Medicine | Admitting: Internal Medicine

## 2022-08-20 DIAGNOSIS — D649 Anemia, unspecified: Secondary | ICD-10-CM

## 2022-08-20 DIAGNOSIS — E611 Iron deficiency: Secondary | ICD-10-CM | POA: Insufficient documentation

## 2022-08-20 LAB — CBC WITH DIFFERENTIAL/PLATELET
Abs Immature Granulocytes: 0.02 10*3/uL (ref 0.00–0.07)
Basophils Absolute: 0 10*3/uL (ref 0.0–0.1)
Basophils Relative: 0 %
Eosinophils Absolute: 0 10*3/uL (ref 0.0–0.5)
Eosinophils Relative: 1 %
HCT: 36.3 % (ref 36.0–46.0)
Hemoglobin: 11.6 g/dL — ABNORMAL LOW (ref 12.0–15.0)
Immature Granulocytes: 0 %
Lymphocytes Relative: 26 %
Lymphs Abs: 1.4 10*3/uL (ref 0.7–4.0)
MCH: 22.5 pg — ABNORMAL LOW (ref 26.0–34.0)
MCHC: 32 g/dL (ref 30.0–36.0)
MCV: 70.5 fL — ABNORMAL LOW (ref 80.0–100.0)
Monocytes Absolute: 0.3 10*3/uL (ref 0.1–1.0)
Monocytes Relative: 6 %
Neutro Abs: 3.8 10*3/uL (ref 1.7–7.7)
Neutrophils Relative %: 67 %
Platelets: 380 10*3/uL (ref 150–400)
RBC: 5.15 MIL/uL — ABNORMAL HIGH (ref 3.87–5.11)
RDW: 26.4 % — ABNORMAL HIGH (ref 11.5–15.5)
WBC: 5.6 10*3/uL (ref 4.0–10.5)
nRBC: 0 % (ref 0.0–0.2)

## 2022-08-20 MED ORDER — SODIUM CHLORIDE 0.9 % IV SOLN
200.0000 mg | Freq: Once | INTRAVENOUS | Status: AC
  Administered 2022-08-20: 200 mg via INTRAVENOUS
  Filled 2022-08-20: qty 200

## 2022-08-20 MED ORDER — SODIUM CHLORIDE 0.9 % IV SOLN
Freq: Once | INTRAVENOUS | Status: AC
  Filled 2022-08-20: qty 250

## 2022-08-20 NOTE — Progress Notes (Signed)
Patient here today for follow up regarding anemia. Patient denies concerns today.  

## 2022-08-20 NOTE — Patient Instructions (Signed)
[facility]  Discharge Instructions: Thank you for choosing [facility] to provide your oncology and hematology care.  If you have a lab appointment with the Cancer Center, please go directly to the Cancer Center and check in at the registration area.  Wear comfortable clothing and clothing appropriate for easy access to any Portacath or PICC line.   We strive to give you quality time with your provider. You may need to reschedule your appointment if you arrive late (15 or more minutes).  Arriving late affects you and other patients whose appointments are after yours.  Also, if you miss three or more appointments without notifying the office, you may be dismissed from the clinic at the provider's discretion.      For prescription refill requests, have your pharmacy contact our office and allow 72 hours for refills to be completed.    Today you received the following chemotherapy and/or immunotherapy agents VENOFER      To help prevent nausea and vomiting after your treatment, we encourage you to take your nausea medication as directed.  BELOW ARE SYMPTOMS THAT SHOULD BE REPORTED IMMEDIATELY: *FEVER GREATER THAN 100.4 F (38 C) OR HIGHER *CHILLS OR SWEATING *NAUSEA AND VOMITING THAT IS NOT CONTROLLED WITH YOUR NAUSEA MEDICATION *UNUSUAL SHORTNESS OF BREATH *UNUSUAL BRUISING OR BLEEDING *URINARY PROBLEMS (pain or burning when urinating, or frequent urination) *BOWEL PROBLEMS (unusual diarrhea, constipation, pain near the anus) TENDERNESS IN MOUTH AND THROAT WITH OR WITHOUT PRESENCE OF ULCERS (sore throat, sores in mouth, or a toothache) UNUSUAL RASH, SWELLING OR PAIN  UNUSUAL VAGINAL DISCHARGE OR ITCHING   Items with * indicate a potential emergency and should be followed up as soon as possible or go to the Emergency Department if any problems should occur.  Please show the CHEMOTHERAPY ALERT CARD or IMMUNOTHERAPY ALERT CARD at check-in to the  Emergency Department and triage nurse.  Should you have questions after your visit or need to cancel or reschedule your appointment, please contact [facility]  [phone] and follow the prompts.  Office hours are 8:00 a.m. to 4:30 p.m. Monday - Friday. Please note that voicemails left after 4:00 p.m. may not be returned until the following business day.  We are closed weekends and major holidays. You have access to a nurse at all times for urgent questions. Please call the main number to the clinic [phone] and follow the prompts.  For any non-urgent questions, you may also contact your provider using MyChart. We now offer e-Visits for anyone 18 and older to request care online for non-urgent symptoms. For details visit [email or web address].   Also download the MyChart app! Go to the app store, search "MyChart", open the app, select [facility], and log in with your MyChart username and password.  Masks are optional in the cancer centers. If you would like for your care team to wear a mask while they are taking care of you, please let them know. For doctor visits, patients may have with them one support person who is at least 42 years old. At this time, visitors are not allowed in the infusion area.  Iron Sucrose Injection What is this medication? IRON SUCROSE (EYE ern SOO krose) treats low levels of iron (iron deficiency anemia) in people with kidney disease. Iron is a mineral that plays an important role in making red blood cells, which carry oxygen from your lungs to the rest of your body. This medicine may be   used for other purposes; ask your health care provider or pharmacist if you have questions. COMMON BRAND NAME(S): Venofer What should I tell my care team before I take this medication? They need to know if you have any of these conditions: Anemia not caused by low iron levels Heart disease High levels of iron in the blood Kidney disease Liver  disease An unusual or allergic reaction to iron, other medications, foods, dyes, or preservatives Pregnant or trying to get pregnant Breast-feeding How should I use this medication? This medication is for infusion into a vein. It is given in a hospital or clinic setting. Talk to your care team about the use of this medication in children. While this medication may be prescribed for children as young as 2 years for selected conditions, precautions do apply. Overdosage: If you think you have taken too much of this medicine contact a poison control center or emergency room at once. NOTE: This medicine is only for you. Do not share this medicine with others. What if I miss a dose? It is important not to miss your dose. Call your care team if you are unable to keep an appointment. What may interact with this medication? Do not take this medication with any of the following: Deferoxamine Dimercaprol Other iron products This medication may also interact with the following: Chloramphenicol Deferasirox This list may not describe all possible interactions. Give your health care provider a list of all the medicines, herbs, non-prescription drugs, or dietary supplements you use. Also tell them if you smoke, drink alcohol, or use illegal drugs. Some items may interact with your medicine. What should I watch for while using this medication? Visit your care team regularly. Tell your care team if your symptoms do not start to get better or if they get worse. You may need blood work done while you are taking this medication. You may need to follow a special diet. Talk to your care team. Foods that contain iron include: whole grains/cereals, dried fruits, beans, or peas, leafy green vegetables, and organ meats (liver, kidney). What side effects may I notice from receiving this medication? Side effects that you should report to your care team as soon as possible: Allergic reactions--skin rash, itching, hives,  swelling of the face, lips, tongue, or throat Low blood pressure--dizziness, feeling faint or lightheaded, blurry vision Shortness of breath Side effects that usually do not require medical attention (report to your care team if they continue or are bothersome): Flushing Headache Joint pain Muscle pain Nausea Pain, redness, or irritation at injection site This list may not describe all possible side effects. Call your doctor for medical advice about side effects. You may report side effects to FDA at 1-800-FDA-1088. Where should I keep my medication? This medication is given in a hospital or clinic and will not be stored at home. NOTE: This sheet is a summary. It may not cover all possible information. If you have questions about this medicine, talk to your doctor, pharmacist, or health care provider.  2023 Elsevier/Gold Standard (2007-12-19 00:00:00)   

## 2022-08-20 NOTE — Patient Instructions (Signed)
#  Recommend gentle iron 1 pill a day; should not upset your stomach or cause constipation.  Talk to the pharmacist if you can find it/it is over-the-counter.  

## 2022-08-20 NOTE — Assessment & Plan Note (Addendum)
#   Severe anemia-Likely due to iron deficiency hemoglobin 7-8.  Symptomatic.  S/p 4 IV Venofer infusions.- SEP 2023- Hb 11.8. Proceed with Venofer today.  Recommend gentle iron once a day.  If constipated recommend every other day.  #Etiology of iron deficiency: Likely secondary menorrhagia; [place]- fibroids- ? Surgery [Dr.[facility]].  # Hx of asthma- recent exacerbation [s/p Dr.Aron Thompson];on nebs- STABLE.   # DISPOSITION: # venofer  # follow up in 4 months MD: labs- cbc;bmp; iron studies; ferritin- ;possible venofer-Dr.B

## 2022-08-20 NOTE — Progress Notes (Signed)
[place] NOTE  Patient Care Team: Bonnita Hollow, MD as PCP - General (Family Medicine) Chrismon, Vickki Muff, PA-C (Inactive) (Physician Assistant) Cammie Sickle, MD as Consulting Physician (Oncology)  CHIEF COMPLAINTS/PURPOSE OF CONSULTATION: ANEMIA  HEMATOLOGY HISTORY  # ANEMIA[Hb; MCV-platelets- WBC; Iron sat; ferritin;  GFR- CT/US- ;  EGD/colonoscopy-none  Menorrhagia:  not on BCPs. [Dr.Harris; but [facility]]   Latest Reference Range & Units 06/04/22 17:40  WBC 4.0 - 10.5 K/uL 6.6  RBC 3.87 - 5.11 MIL/uL 4.91  Hemoglobin 12.0 - 15.0 g/dL 8.4 (L)  HCT 36.0 - 46.0 % 30.3 (L)  MCV 80.0 - 100.0 fL 61.7 (L)  MCH 26.0 - 34.0 pg 17.1 (L)  MCHC 30.0 - 36.0 g/dL 27.7 (L)  RDW 11.5 - 15.5 % 20.9 (H)  Platelets 150 - 400 K/uL 329  nRBC 0.0 - 0.2 % 0.0  (L): Data is abnormally low (H): Data is abnormally high   Latest Reference Range & Units 01/03/17 08:52  Iron 27 - 159 ug/dL 57  UIBC 131 - 425 ug/dL 395  TIBC 250 - 450 ug/dL 452 (H)  Iron Saturation 15 - 55 % 13 (L)  (H): Data is abnormally high (L): Data is abnormally low  HISTORY OF PRESENTING ILLNESS: Alone.  Ambulating independently.  Michelle Hardy 41 y.o.  female pleasant patient with iron deficiency anemia secondary to menorrhagia is here for follow-up.  Patient status post IV iron infusion x4.  Notes slight improvement of her energy levels.   Review of Systems  Constitutional:  Negative for chills, diaphoresis, fever and weight loss.  HENT:  Negative for nosebleeds and sore throat.   Eyes:  Negative for double vision.  Respiratory:  Negative for hemoptysis, sputum production and wheezing.   Cardiovascular:  Negative for chest pain, palpitations, orthopnea and leg swelling.  Gastrointestinal:  Negative for abdominal pain, blood in stool, constipation, diarrhea, heartburn, melena, nausea and vomiting.  Genitourinary:  Negative for dysuria, frequency and urgency.   Musculoskeletal:  Negative for back pain and joint pain.  Skin: Negative.  Negative for itching and rash.  Neurological:  Negative for tingling, focal weakness, weakness and headaches.  Endo/Heme/Allergies:  Does not bruise/bleed easily.  Psychiatric/Behavioral:  Negative for depression. The patient is not nervous/anxious and does not have insomnia.      MEDICAL HISTORY:  Past Medical History:  Diagnosis Date   Anemia    Asthma    Family history of adverse reaction to anesthesia    mother has problems with blood pressure and difficulty staying asleep during surgery   Headache    History of uterine fibroid    PONV (postoperative nausea and vomiting)     SURGICAL HISTORY: Past Surgical History:  Procedure Laterality Date   LAPAROTOMY N/A 02/06/2017   Procedure: LAPAROTOMY;  Surgeon: Gae Dry, MD;  Location: [facility] OR[facility];  Service: Gynecology;  Laterality: N/A;   MYOMECTOMY  2011   MYOMECTOMY N/A 02/06/2017   Procedure: MYOMECTOMY;  Surgeon: Gae Dry, MD;  Location: [facility];  Service: Gynecology;  Laterality: N/A;   UTERINE FIBROID SURGERY      SOCIAL HISTORY: Social History   Socioeconomic History   Marital status: Single    Spouse name: Not on file   Number of children: Not on file   Years of education: Not on file   Highest education level: Not on file  Occupational History   Not on file  Tobacco Use   Smoking status:  Never    Passive exposure: Never   Smokeless tobacco: Never  Vaping Use   Vaping Use: Never used  Substance and Sexual Activity   Alcohol use: Not Currently   Drug use: Never   Sexual activity: Yes    Birth control/protection: None  Other Topics Concern   Not on file  Social History Narrative   Lives in [place]; self; 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    Social Determinants of Health   Financial Resource Strain: Not on file  Food Insecurity: Not on file  Transportation Needs: Not on file  Physical  Activity: Not on file  Stress: Not on file  Social Connections: Not on file  Intimate Partner Violence: Not on file    FAMILY HISTORY: Family History  Problem Relation Age of Onset   Fibromyalgia Mother    Stroke Mother    Headache Mother    Diabetes Father    Arthritis Father    Tongue cancer Maternal Uncle    Tongue cancer Maternal Uncle    Tongue cancer Maternal Uncle    Bone cancer Maternal Grandfather     ALLERGIES:  is allergic to coconut (cocos nucifera) and coconut fatty acids.  MEDICATIONS:  Current Outpatient Medications  Medication Sig Dispense Refill   albuterol (VENTOLIN HFA) 108 (90 Base) MCG/ACT inhaler Inhale 2 puffs into the lungs every 6 (six) hours as needed for wheezing or shortness of breath. 8 g 2   Atogepant (QULIPTA) 60 MG TABS Take 60 mg by mouth daily. 30 tablet 11   fluticasone (FLONASE) 50 MCG/ACT nasal spray Place 2 sprays into both nostrils daily. 16 g 6   ibuprofen (ADVIL) 800 MG tablet Take 1 tablet (800 mg total) by mouth every 8 (eight) hours as needed. 30 tablet 0   Nebulizer System All-In-One MISC 1 each by Does not apply route as needed (cough). 1 each 0   rizatriptan (MAXALT-MLT) 10 MG disintegrating tablet Take 1 tablet (10 mg total) by mouth as needed for migraine. May repeat in 2 hours if needed 9 tablet 11   Ubrogepant (UBRELVY) 100 MG TABS Take 100 mg by mouth every 2 (two) hours as needed. Maximum '200mg'$  a day. 10 tablet 0   ipratropium-albuterol (DUONEB) 0.5-2.5 (3) MG/3ML SOLN Take 3 mLs by nebulization every 4 (four) hours as needed (cough, wheezing). 360 mL 0   No current facility-administered medications for this visit.     Marland Kitchen  PHYSICAL EXAMINATION:   Vitals:   08/20/22 1444  BP: 134/83  Pulse: (!) 107  Resp: 18  Temp: 98.4 F (36.9 C)   Filed Weights   08/20/22 1444  Weight: 186 lb 9.6 oz (84.6 kg)   Appears pale. Physical Exam Vitals and nursing note reviewed.  HENT:     Head: Normocephalic and atraumatic.      Mouth/Throat:     Pharynx: Oropharynx is clear.  Eyes:     Extraocular Movements: Extraocular movements intact.     Pupils: Pupils are equal, round, and reactive to light.  Cardiovascular:     Rate and Rhythm: Normal rate and regular rhythm.  Pulmonary:     Comments: Decreased breath sounds bilaterally.  Abdominal:     Palpations: Abdomen is soft.  Musculoskeletal:        General: Normal range of motion.     Cervical back: Normal range of motion.  Skin:    General: Skin is warm.  Neurological:     General: No focal  deficit present.     Mental Status: She is alert and oriented to person, place, and time.  Psychiatric:        Behavior: Behavior normal.        Judgment: Judgment normal.      LABORATORY DATA:  I have reviewed the data as listed Lab Results  Component Value Date   WBC 5.6 08/20/2022   HGB 11.6 (L) 08/20/2022   HCT 36.3 08/20/2022   MCV 70.5 (L) 08/20/2022   PLT 380 08/20/2022   Recent Labs    06/04/22 1740 06/19/22 1437  NA 137 137  K 3.7 3.4*  CL 107 104  CO2 24 25  GLUCOSE 101* 118*  BUN 10 9  CREATININE 0.65 0.76  CALCIUM 9.4 9.0  GFRNONAA >60 >60  PROT 8.6* 7.3  ALBUMIN 4.4 3.7  AST 18 16  ALT 15 17  ALKPHOS 59 53  BILITOT 0.5 0.3     US PELVIS TRANSVAGINAL NON-OB (TV ONLY)  Result Date: 07/29/2022 Patient Name: Michelle Hardy DOB: [birth year] MRN: [id number] LMP: 06/30/2022  ULTRASOUND REPORT Location: [facility] Date of Service: 07/25/2022 Indications: h/o fibroids, menorrhagia with regular cycle Findings: The uterus is anteverted, enlarged and measures 13.5 x 10.9 x 9.1cm. Echo texture is heterogenous with evidence of focal masses. Within the uterus are multiple suspected fibroids. The largest three measuring: Fibroid 1: Intramural with submucosal extension, Anterior Fundal, 8.8 x 5.7cm Fibroid 2: Subserosal, Posterior Fundal, 4.4 x 2.0cm Fibroid 3: Subserosal, Posterior Fundal, 2.9 x 1.4cm The Endometrium measures 22.6 mm. Right Ovary  measures 2.8 x 1.7 x 1.5 cm. It is normal in appearance. Left Ovary measures 6.0 x 6.0 x 3.9 cm. It is not normal in appearance. - complex, hemorrhagic cyst measuring 4.9 x 3.1cm - no evidence of ovarian torsion Survey of the adnexa demonstrates no adnexal masses. There is no free fluid in the cul de sac. Impression: 1. Enlarged, fibroid Uterus 2. Intramural fibroid with submucosal extension 3. Complex, hemorrhagic cyst within the Left Ovary Recommendations: 1.Clinical correlation with the patient's History and Physical Exam. Edwena Bunde, RDMS, RVT The ultrasound images and findings were reviewed by me and I agree with the above report. Finis Bud, M.D. 07/29/2022 6:35 PM  US PELVIS (TRANSABDOMINAL ONLY)  Result Date: 07/29/2022 Patient Name: Jyoti Harju DOB: 14-Feb-1981 MRN: [id number] LMP: 06/30/2022  ULTRASOUND REPORT Location: [facility] Date of Service: 07/25/2022 Indications: h/o fibroids, menorrhagia with regular cycle Findings: The uterus is anteverted, enlarged and measures 13.5 x 10.9 x 9.1cm. Echo texture is heterogenous with evidence of focal masses. Within the uterus are multiple suspected fibroids. The largest three measuring: Fibroid 1: Intramural with submucosal extension, Anterior Fundal, 8.8 x 5.7cm Fibroid 2: Subserosal, Posterior Fundal, 4.4 x 2.0cm Fibroid 3: Subserosal, Posterior Fundal, 2.9 x 1.4cm The Endometrium measures 22.6 mm. Right Ovary measures 2.8 x 1.7 x 1.5 cm. It is normal in appearance. Left Ovary measures 6.0 x 6.0 x 3.9 cm. It is not normal in appearance. - complex, hemorrhagic cyst measuring 4.9 x 3.1cm - no evidence of ovarian torsion Survey of the adnexa demonstrates no adnexal masses. There is no free fluid in the cul de sac. Impression: 1. Enlarged, fibroid Uterus 2. Intramural fibroid with submucosal extension 3. Complex, hemorrhagic cyst within the Left Ovary Recommendations: 1.Clinical correlation with the patient's History and Physical Exam. Edwena Bunde, RDMS, RVT The ultrasound images and findings were reviewed by me and I agree with the above report. Finis Bud, M.D. 07/29/2022  6:35 PM   ASSESSMENT & PLAN:   Symptomatic anemia # Severe anemia-Likely due to iron deficiency hemoglobin 7-8.  Symptomatic.  S/p 4 IV Venofer infusions.- SEP 2023- Hb 11.8. Proceed with Venofer today.  Recommend gentle iron once a day.  If constipated recommend every other day.  #Etiology of iron deficiency: Likely secondary menorrhagia; [place]- fibroids- ? Surgery [Dr.[facility]].  # Hx of asthma- recent exacerbation [s/p Dr.Aron Thompson];on nebs- STABLE.   # DISPOSITION: # venofer  # follow up in 4 months MD: labs- cbc;bmp; iron studies; ferritin- ;possible venofer-Dr.B    All questions were answered. The patient knows to call the clinic with any problems, questions or concerns.    Cammie Sickle, MD 08/20/2022 3:32 PM

## 2022-09-09 DIAGNOSIS — M5432 Sciatica, left side: Secondary | ICD-10-CM | POA: Diagnosis not present

## 2022-10-15 ENCOUNTER — Telehealth: Payer: Self-pay | Admitting: Obstetrics and Gynecology

## 2022-10-15 NOTE — Telephone Encounter (Signed)
-----   Message from [place] sent at 10/14/2022  4:32 PM EST ----- Regarding: FW: ultrasound followup Please schedule video u/s f/u w/ DE. Thanks. ----- Message ----- From: Marykay Lex, CMA Sent: 08/05/2022   3:23 PM EST To: Ws-Admin Subject: ultrasound followup                            Patient needs ultrasound follow-up, can be virtual or in office

## 2022-10-15 NOTE — Telephone Encounter (Signed)
I contacted patient via phone to scheduled ultrasound follow up. I offer virtual visits this week and patient declined all due to having to travel to work. I offer in patient appointment for 12/22 for in office. Patient states she would be out of town on vaction. I offered 12/28 for in office late afternoon appointment and patient requested to call back to be scheduled once she spoke with her supervisor.

## 2022-11-07 ENCOUNTER — Ambulatory Visit: Payer: BC Managed Care – PPO | Admitting: Obstetrics and Gynecology

## 2022-11-07 ENCOUNTER — Encounter: Payer: Self-pay | Admitting: Internal Medicine

## 2022-11-07 ENCOUNTER — Encounter: Payer: Self-pay | Admitting: Obstetrics and Gynecology

## 2022-11-07 VITALS — BP 116/83 | HR 96 | Ht 67.0 in | Wt 189.5 lb

## 2022-11-07 DIAGNOSIS — N83202 Unspecified ovarian cyst, left side: Secondary | ICD-10-CM

## 2022-11-07 DIAGNOSIS — N920 Excessive and frequent menstruation with regular cycle: Secondary | ICD-10-CM

## 2022-11-07 DIAGNOSIS — D219 Benign neoplasm of connective and other soft tissue, unspecified: Secondary | ICD-10-CM | POA: Diagnosis not present

## 2022-11-07 NOTE — Progress Notes (Signed)
Patient presents today for ultrasound follow-up. She states heavy cycles with severe cramping along with ongoing left sided pain. No additional concerns.
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
## 2022-11-11 DIAGNOSIS — Z7189 Other specified counseling: Secondary | ICD-10-CM | POA: Diagnosis not present

## 2022-11-25 ENCOUNTER — Encounter: Payer: Self-pay | Admitting: Neurology

## 2022-11-25 ENCOUNTER — Telehealth: Payer: Self-pay | Admitting: Neurology

## 2022-11-25 ENCOUNTER — Other Ambulatory Visit: Payer: BC Managed Care – PPO

## 2022-11-25 ENCOUNTER — Telehealth: Payer: BC Managed Care – PPO | Admitting: Neurology

## 2022-11-25 DIAGNOSIS — G43001 Migraine without aura, not intractable, with status migrainosus: Secondary | ICD-10-CM | POA: Diagnosis not present

## 2022-11-25 MED ORDER — UBRELVY 100 MG PO TABS: 100.0000 mg | ORAL_TABLET | ORAL | 11 refills | Status: DC | PRN

## 2022-11-25 MED ORDER — QULIPTA 60 MG PO TABS: 60.0000 mg | ORAL_TABLET | Freq: Every day | ORAL | 11 refills | Status: AC

## 2022-11-25 MED ORDER — RIZATRIPTAN BENZOATE 10 MG PO TBDP: 10.0000 mg | ORAL_TABLET | ORAL | 11 refills | Status: AC | PRN
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
## 2022-11-25 NOTE — Telephone Encounter (Signed)
Schedule her with NP for med refill 10/2023 thanks

## 2022-11-25 NOTE — Telephone Encounter (Signed)
Pt scheduled for in office follow up with Megan for 10/20/23 at 2:30pm

## 2022-11-27 ENCOUNTER — Other Ambulatory Visit: Payer: BC Managed Care – PPO

## 2022-12-11 ENCOUNTER — Ambulatory Visit: Payer: BC Managed Care – PPO

## 2022-12-12 ENCOUNTER — Other Ambulatory Visit: Payer: BC Managed Care – PPO

## 2022-12-12 DIAGNOSIS — Z7189 Other specified counseling: Secondary | ICD-10-CM | POA: Diagnosis not present

## 2022-12-17 ENCOUNTER — Encounter: Payer: Self-pay | Admitting: Family Medicine

## 2022-12-17 ENCOUNTER — Ambulatory Visit: Payer: 59 | Admitting: Family Medicine

## 2022-12-23 ENCOUNTER — Encounter: Payer: Self-pay | Admitting: Family Medicine

## 2022-12-23 ENCOUNTER — Telehealth (INDEPENDENT_AMBULATORY_CARE_PROVIDER_SITE_OTHER): Payer: BC Managed Care – PPO | Admitting: Family Medicine

## 2022-12-23 DIAGNOSIS — R6889 Other general symptoms and signs: Secondary | ICD-10-CM | POA: Diagnosis not present

## 2022-12-23 DIAGNOSIS — D500 Iron deficiency anemia secondary to blood loss (chronic): Secondary | ICD-10-CM | POA: Diagnosis not present

## 2022-12-23 NOTE — Assessment & Plan Note (Signed)
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  Significant improvement in hemoglobin seen over the past several months status post IV transfusions.  Patient followed by hematology.  Patient has planned upcoming myomectomy scheduled with OB/GYN.  Patient reports ongoing difficulties with cold intolerance due to anemia and requesting work accommodation for Hydrologist. Accommodation written.  Patient follow-up as needed.

## 2022-12-23 NOTE — Progress Notes (Signed)
Virtual Visit via Video Note  I connected with Michelle Hardy on 12/23/2022 at  1:40 PM EST by a video enabled telemedicine application and verified that I am speaking with the correct person using two identifiers.  Location: Patient: Home Provider: Office   I discussed the limitations of evaluation and management by telemedicine and the availability of in person appointments. The patient expressed understanding and agreed to proceed.  History of Present Illness:  Chief Complaint  Patient presents with   Medical accomadation    Currently works in an old, drafty building, and in order to obtain a Hydrologist, needs a medical note. Patient has anemia.     The patient reported struggling with fibroids which contributed to significant bleeding, nearly necessitating a transfusion causing patient's ongoing IDA. Patient reports that she has ongoing difficulty regulating heat due to her anemia and and feels excessively cold at her work place.  She reports that her work place temperature is typically around 60 F. This hemodynamic challenge undoubtedly impacts the patient's overall well-being.  At last October the hemoglobin level was 11, which was not severely low, but they have been on iron infusion to help manage this condition. Patient denies increase in fatigue or weakness, suggesting that the anemia is currently stable.     The patient follows with hematology for IV infusions.  The patient has seen an obstetrician-gynecologist in [place] recently, for possible myomectomy, but specific details of that visit or the exact surgery plans were not provided  Review of Systems:  General: No further symptoms of anemia such as fatigue or pallor were reported.  HEENT: No complaints of headaches, visual changes, or rhinitis.  Respiratory: The patient has a history of asthma but reported no recent exacerbations or respiratory complaints through the winter months.  Gastrointestinal: No bleeding  noted which might have signified gastroduodenal sources of anemia.  All other systems are reported negative.  Observations/Objective: There were no vitals filed for this visit.   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  Significant improvement in hemoglobin seen over the past several months status post IV transfusions.  Patient followed by hematology.  Patient has planned upcoming myomectomy scheduled with OB/GYN.  Patient reports ongoing difficulties with cold intolerance due to anemia and requesting work accommodation for Hydrologist. Accommodation written.  Patient follow-up as needed.       Cold intolerance    There are no discontinued medications.   Follow Up Instructions: No follow-ups on file.    I discussed the assessment and treatment plan with the patient. The patient was provided an opportunity to ask questions and all were answered. The patient agreed with the plan and demonstrated an understanding of the instructions.   The patient was advised to call back or seek an in-person evaluation if the symptoms worsen or if the condition fails to improve as anticipated.  I provided 15 minutes of non-face-to-face time during this encounter.   Bonnita Hollow, MD

## 2022-12-24 ENCOUNTER — Other Ambulatory Visit: Payer: BC Managed Care – PPO

## 2022-12-24 ENCOUNTER — Ambulatory Visit: Payer: BC Managed Care – PPO

## 2022-12-24 ENCOUNTER — Ambulatory Visit: Payer: BC Managed Care – PPO | Admitting: Internal Medicine

## 2023-01-10 DIAGNOSIS — Z7189 Other specified counseling: Secondary | ICD-10-CM | POA: Diagnosis not present

## 2023-01-14 ENCOUNTER — Ambulatory Visit
Admission: RE | Admit: 2023-01-14 | Discharge: 2023-01-14 | Disposition: A | Discharge status: Home / Self Care | Payer: BC Managed Care – PPO | Source: Ambulatory Visit

## 2023-01-14 VITALS — BP 139/87 | HR 98 | Temp 99.3°F | Resp 16 | Ht 67.0 in | Wt 185.0 lb

## 2023-01-14 DIAGNOSIS — J4521 Mild intermittent asthma with (acute) exacerbation: Secondary | ICD-10-CM | POA: Diagnosis not present

## 2023-01-14 DIAGNOSIS — J069 Acute upper respiratory infection, unspecified: Secondary | ICD-10-CM | POA: Diagnosis not present

## 2023-01-14 LAB — GROUP A STREP BY PCR: Group A Strep by PCR: NOT DETECTED

## 2023-01-14 MED ORDER — PROMETHAZINE-DM 6.25-15 MG/5ML PO SYRP: 5.0000 mL | ORAL_SOLUTION | Freq: Two times a day (BID) | ORAL | 0 refills | Status: DC | PRN

## 2023-01-14 MED ORDER — PREDNISONE 10 MG (21) PO TBPK: ORAL_TABLET | ORAL | 0 refills | Status: DC
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
## 2023-01-14 NOTE — Discharge Instructions (Signed)
Your strep test was negative.  I am concerned that you have an upper respiratory infection that triggered your asthma.  Start prednisone taper.  Do not take NSAIDs with this medication including aspirin, ibuprofen/Advil, naproxen/Aleve.  Use Promethazine DM for cough at night.  This will make you sleepy so do not drive or drink alcohol with taking it.  Use over-the-counter medications as needed.  If your symptoms or not proving within a week return for reevaluation.  If anything worsens and you have worsening cough, shortness of breath, fever, nausea, vomiting you should be seen immediately.

## 2023-01-14 NOTE — ED Triage Notes (Signed)
Pt c/o sore throat & cold symptoms since 2/29. Taking OTC cold meds consistently, but not getting better. Took an at-home Covid test on 3/1, and it was neg.

## 2023-01-30 ENCOUNTER — Ambulatory Visit: Payer: BC Managed Care – PPO | Admitting: Family Medicine

## 2023-01-30 VITALS — BP 124/72 | HR 75 | Temp 97.0°F | Wt 184.8 lb

## 2023-01-30 DIAGNOSIS — J4541 Moderate persistent asthma with (acute) exacerbation: Secondary | ICD-10-CM | POA: Diagnosis not present

## 2023-01-30 MED ORDER — MONTELUKAST SODIUM 10 MG PO TABS: 10.0000 mg | ORAL_TABLET | Freq: Every day | ORAL | 3 refills | Status: DC

## 2023-01-30 MED ORDER — PREDNISONE 50 MG PO TABS: ORAL_TABLET | ORAL | 0 refills | Status: DC

## 2023-01-30 MED ORDER — CETIRIZINE HCL 10 MG PO TABS: 10.0000 mg | ORAL_TABLET | Freq: Every day | ORAL | 0 refills | Status: AC

## 2023-01-30 MED ORDER — FLUTICASONE PROPIONATE 50 MCG/ACT NA SUSP: 2.0000 | Freq: Every day | NASAL | 6 refills | Status: DC

## 2023-01-30 NOTE — Assessment & Plan Note (Signed)
Patient presents with a persistent, primarily dry cough following what appears to be a viral illness. The cough is exacerbated by multiple triggers and has been ongoing for nearly a month, despite a course of prednisone.   Differential diagnosis: Post-viral cough syndrome. Allergic bronchospasm possibly exacerbating asthma. Undiagnosed lower respiratory tract infection such as walking pneumonia.  Plan:  Prescribe another course of prednisone 50 mg to address inflammatory component. Initiate montelukast (Singulair) 10 mg as an adjunct for asthma and potential allergic component. Encourage re-initiation of fluticasone (Flonase) nasal spray for possible postnasal drip contributing to cough. Continue with albuterol and nebulizer treatments as needed, while discussing the potential risk of overuse. Obtain chest X-ray to rule out any underlying pneumonia or other pathology. Advise return if symptoms worsen or do not improve, particularly after completing the prednisone course.
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
## 2023-01-30 NOTE — Patient Instructions (Signed)
For asthma, please use the prednisone, Singulair, Zyrtec, and Flonase as prescribed.  You may also continue to use your albuterol inhaler and nebulizers as needed.  For chest x-ray please go to: [facility] [address] [phone]

## 2023-02-06 ENCOUNTER — Telehealth: Payer: Self-pay | Admitting: Family Medicine

## 2023-02-06 NOTE — Telephone Encounter (Signed)
Michelle Hardy from [facility] located in  [place] ,[place]  called and would like the last office note for the pt,most recent EKG and lab results . Fax # is [phone] and the phone # is [phone]

## 2023-02-06 NOTE — Telephone Encounter (Signed)
Spoke with patient to confirm and she stated it was ok to fax over information. She stated that she was having her surgery [facility] outgoing fax to [facility] at number listed below.

## 2023-02-10 ENCOUNTER — Ambulatory Visit (INDEPENDENT_AMBULATORY_CARE_PROVIDER_SITE_OTHER): Payer: BC Managed Care – PPO

## 2023-02-10 DIAGNOSIS — Z7189 Other specified counseling: Secondary | ICD-10-CM | POA: Diagnosis not present

## 2023-02-10 DIAGNOSIS — J4541 Moderate persistent asthma with (acute) exacerbation: Secondary | ICD-10-CM | POA: Diagnosis not present

## 2023-02-10 DIAGNOSIS — R059 Cough, unspecified: Secondary | ICD-10-CM | POA: Diagnosis not present

## 2023-02-10 HISTORY — PX: ABDOMINAL HYSTERECTOMY: SHX81

## 2023-03-12 DIAGNOSIS — Z7189 Other specified counseling: Secondary | ICD-10-CM | POA: Diagnosis not present

## 2023-03-19 ENCOUNTER — Encounter: Payer: Self-pay | Admitting: *Deleted

## 2023-03-19 ENCOUNTER — Telehealth: Payer: Self-pay | Admitting: *Deleted

## 2023-03-19 NOTE — Telephone Encounter (Signed)
Initiated Bennie Pierini PA on CMM. Key: [id number]. Awaiting clinical questions.

## 2023-04-03 NOTE — Telephone Encounter (Signed)
PA done, see other note. Will see if [employer] will approve.

## 2023-04-03 NOTE — Telephone Encounter (Signed)
Qulipta PA sent to plan via CMM. Key: [id number]. Awaiting determination from [employer].

## 2023-04-03 NOTE — Telephone Encounter (Signed)
Per Dr Lucia Gaskins, Pt's BP is on soft side so beta blockers and other blood pressure medications are contraindicated due to hypotension.

## 2023-04-08 NOTE — Telephone Encounter (Signed)
Received fax from [facility] stating Michelle Hardy has been approved through 06/26/23. PA reference # [id number].

## 2023-04-12 DIAGNOSIS — Z7189 Other specified counseling: Secondary | ICD-10-CM | POA: Diagnosis not present

## 2023-04-28 ENCOUNTER — Other Ambulatory Visit: Payer: Self-pay | Admitting: Family Medicine

## 2023-04-28 DIAGNOSIS — J4541 Moderate persistent asthma with (acute) exacerbation: Secondary | ICD-10-CM
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
## 2023-04-28 NOTE — Telephone Encounter (Signed)
Chart supports rx. Last OV: 01/30/2023

## 2023-05-12 DIAGNOSIS — Z7189 Other specified counseling: Secondary | ICD-10-CM | POA: Diagnosis not present

## 2023-05-16 ENCOUNTER — Ambulatory Visit: Payer: BC Managed Care – PPO | Admitting: Family Medicine

## 2023-05-16 ENCOUNTER — Encounter: Payer: Self-pay | Admitting: Family Medicine

## 2023-05-16 VITALS — BP 124/82 | HR 112 | Temp 97.8°F | Wt 181.2 lb

## 2023-05-16 DIAGNOSIS — J4541 Moderate persistent asthma with (acute) exacerbation: Secondary | ICD-10-CM | POA: Diagnosis not present

## 2023-05-16 DIAGNOSIS — U071 COVID-19: Secondary | ICD-10-CM | POA: Insufficient documentation

## 2023-05-16 LAB — POCT INFLUENZA A/B
Influenza A, POC: NEGATIVE
Influenza B, POC: NEGATIVE

## 2023-05-16 LAB — POC COVID19 BINAXNOW: SARS Coronavirus 2 Ag: POSITIVE — AB

## 2023-05-16 MED ORDER — PROMETHAZINE-DM 6.25-15 MG/5ML PO SYRP: 5.0000 mL | ORAL_SOLUTION | Freq: Two times a day (BID) | ORAL | 0 refills | Status: DC | PRN

## 2023-05-16 MED ORDER — DEXAMETHASONE 4 MG PO TABS: 4.0000 mg | ORAL_TABLET | Freq: Two times a day (BID) | ORAL | 0 refills | Status: AC

## 2023-05-16 MED ORDER — NIRMATRELVIR/RITONAVIR (PAXLOVID)TABLET: 3.0000 | ORAL_TABLET | Freq: Two times a day (BID) | ORAL | 0 refills | Status: AC

## 2023-05-16 NOTE — Assessment & Plan Note (Signed)
Assessment and Plan    COVID-19: Positive test result with symptoms of cough, chest tightness, and sore throat since Saturday. Vaccinated with two doses and a booster. No fever or severe respiratory distress. Mild tachycardia noted. -Continue supportive care with over-the-counter Delsym for cough and throat soothing. -Start dexamethasone 4mg  four times daily for five days. -Start Paxlovid course. -Use albuterol inhaler every 2-3 hours as needed. -Check temperature regularly at home. -Return to emergency department if symptoms worsen (chest pain, shortness of breath, fever). -Follow-up in 3-4 days if not worsening.  Asthma: History of asthma, currently experiencing chest tightness and cough. Using albuterol inhaler and taking Singulair and Montelukast regularly. -Continue current asthma medications. -Use albuterol inhaler more frequently as needed. -Re-evaluate asthma management after resolution of COVID-19 symptoms.
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
## 2023-06-12 DIAGNOSIS — Z7189 Other specified counseling: Secondary | ICD-10-CM | POA: Diagnosis not present

## 2023-06-23 ENCOUNTER — Encounter: Payer: Self-pay | Admitting: Internal Medicine

## 2023-06-23 ENCOUNTER — Other Ambulatory Visit (HOSPITAL_COMMUNITY): Payer: Self-pay

## 2023-07-13 DIAGNOSIS — Z7189 Other specified counseling: Secondary | ICD-10-CM | POA: Diagnosis not present

## 2023-08-12 DIAGNOSIS — Z7189 Other specified counseling: Secondary | ICD-10-CM | POA: Diagnosis not present

## 2023-08-26 ENCOUNTER — Telehealth: Payer: Self-pay | Admitting: Family Medicine

## 2023-08-26 NOTE — Telephone Encounter (Signed)
I did not cancel her appt scheduled for 08/28/23.

## 2023-08-26 NOTE — Telephone Encounter (Signed)
08/26/23 - Spoke with pt and transferred her to NT due to Nausea and lightheaded episodes she's been having.

## 2023-08-28 ENCOUNTER — Ambulatory Visit: Payer: BC Managed Care – PPO | Admitting: Family Medicine

## 2023-08-28 ENCOUNTER — Encounter: Payer: Self-pay | Admitting: Family Medicine

## 2023-08-28 VITALS — Temp 98.0°F | Wt 184.8 lb

## 2023-08-28 DIAGNOSIS — D500 Iron deficiency anemia secondary to blood loss (chronic): Secondary | ICD-10-CM

## 2023-08-28 DIAGNOSIS — A084 Viral intestinal infection, unspecified: Secondary | ICD-10-CM

## 2023-08-28 DIAGNOSIS — R42 Dizziness and giddiness: Secondary | ICD-10-CM | POA: Diagnosis not present
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
## 2023-08-29 ENCOUNTER — Encounter: Payer: Self-pay | Admitting: Family Medicine

## 2023-08-29 NOTE — Telephone Encounter (Signed)
Spoke with patient and she stated that zofran was supposed to be sent to her pharmacy. I advised her that the provider is out of the office until Monday and that he will review her message to assist. She verbalized understanding. Please advise

## 2023-08-30 DIAGNOSIS — A084 Viral intestinal infection, unspecified: Secondary | ICD-10-CM | POA: Insufficient documentation

## 2023-08-30 NOTE — Assessment & Plan Note (Addendum)
Patient presents with recurrent episodes of dizziness and nausea, especially noted when missing meals. The symptoms have exacerbated over the past two weeks with increased frequency of dizziness, nausea, and lightheadedness.  Mixed presentation without clear etiology.  Possibly due to BPPV given i how common, possibly due to underlying anemia given patient history of IDA, seems less likely some form postprandial hypoglycemia presentation inconsistent with cardiac or neurologic dysfunction  Plan: Prescribe Ondansetron (Zofran) 4 mg, to be taken every 8 hours as needed. Encourage small frequent meals to prevent hypoglycemic episodes. Continue to monitor fluid intake, aiming for 60-80 ounces per day. Suggest a bland diet to manage gastrointestinal symptoms. If symptoms persist, schedule follow-up for blood work including electrolyte panel, blood counts, iron stores, and thyroid function. Follow-Up Recommendations: Follow-up in 1-2 weeks if symptoms persist or worsen.

## 2023-09-01 MED ORDER — ONDANSETRON 4 MG PO TBDP: 4.0000 mg | ORAL_TABLET | Freq: Three times a day (TID) | ORAL | 0 refills | Status: DC | PRN

## 2023-09-12 DIAGNOSIS — Z7189 Other specified counseling: Secondary | ICD-10-CM | POA: Diagnosis not present

## 2023-09-17 DIAGNOSIS — L700 Acne vulgaris: Secondary | ICD-10-CM | POA: Diagnosis not present

## 2023-09-17 DIAGNOSIS — L2089 Other atopic dermatitis: Secondary | ICD-10-CM | POA: Diagnosis not present

## 2023-10-06 DIAGNOSIS — L2089 Other atopic dermatitis: Secondary | ICD-10-CM | POA: Diagnosis not present

## 2023-10-12 DIAGNOSIS — Z7189 Other specified counseling: Secondary | ICD-10-CM | POA: Diagnosis not present

## 2023-10-20 ENCOUNTER — Ambulatory Visit: Payer: BC Managed Care – PPO | Admitting: Adult Health

## 2023-11-12 DIAGNOSIS — Z7189 Other specified counseling: Secondary | ICD-10-CM | POA: Diagnosis not present

## 2023-11-18 ENCOUNTER — Encounter (INDEPENDENT_AMBULATORY_CARE_PROVIDER_SITE_OTHER): Payer: BC Managed Care – PPO | Admitting: Family Medicine

## 2023-11-18 DIAGNOSIS — E349 Endocrine disorder, unspecified: Secondary | ICD-10-CM

## 2023-11-18 DIAGNOSIS — E8940 Asymptomatic postprocedural ovarian failure: Secondary | ICD-10-CM

## 2023-11-18 DIAGNOSIS — Z90710 Acquired absence of both cervix and uterus: Secondary | ICD-10-CM | POA: Diagnosis not present

## 2023-11-18 NOTE — Telephone Encounter (Signed)
 Thank you for your message seeking medical advice.* My assessment and recommendation are as follows:  Hormonal Dysregulation. Referral to Endocrinology.   Sincerely,  Beverley KATHEE Hummer, MD    *This exchange required the expertise of a doctor, nurse practitioner, physician assistant, optometrist or certified nurse midwife and qualifies as a Medical Advice Message, please visit [email or web address] for more details. [place] will bill your insurance on your behalf; copays and deductibles may apply. Questions? Reply to this message.    This patient gave consent for this Medical Advice Message and is aware that it may result in a bill to [employer], as well as the possibility of receiving a bill for a co-payment or deductible. They are an established patient, but are not seeking medical advice exclusively about a problem treated during an in person or video visit in the last seven days. I did not recommend an in person or video visit within seven days of my reply.    I spent a total of 10 minutes cumulative time within 7 days through [employer].  Beverley KATHEE Hummer, MD

## 2023-11-26 ENCOUNTER — Other Ambulatory Visit (INDEPENDENT_AMBULATORY_CARE_PROVIDER_SITE_OTHER): Payer: BC Managed Care – PPO

## 2023-11-26 DIAGNOSIS — D500 Iron deficiency anemia secondary to blood loss (chronic): Secondary | ICD-10-CM

## 2023-11-26 DIAGNOSIS — R7303 Prediabetes: Secondary | ICD-10-CM | POA: Diagnosis not present

## 2023-11-26 DIAGNOSIS — D251 Intramural leiomyoma of uterus: Secondary | ICD-10-CM | POA: Diagnosis not present

## 2023-11-26 DIAGNOSIS — E88819 Insulin resistance, unspecified: Secondary | ICD-10-CM

## 2023-11-26 DIAGNOSIS — R42 Dizziness and giddiness: Secondary | ICD-10-CM | POA: Diagnosis not present

## 2023-11-26 DIAGNOSIS — A084 Viral intestinal infection, unspecified: Secondary | ICD-10-CM | POA: Diagnosis not present

## 2023-11-26 DIAGNOSIS — D250 Submucous leiomyoma of uterus: Secondary | ICD-10-CM | POA: Diagnosis not present

## 2023-11-27 DIAGNOSIS — R7303 Prediabetes: Secondary | ICD-10-CM | POA: Insufficient documentation

## 2023-11-27 DIAGNOSIS — E88819 Insulin resistance, unspecified: Secondary | ICD-10-CM | POA: Insufficient documentation

## 2023-11-27 LAB — COMPREHENSIVE METABOLIC PANEL
ALT: 11 U/L (ref 0–35)
AST: 9 U/L (ref 0–37)
Albumin: 4.6 g/dL (ref 3.5–5.2)
Alkaline Phosphatase: 58 U/L (ref 39–117)
BUN: 10 mg/dL (ref 6–23)
CO2: 26 meq/L (ref 19–32)
Calcium: 9.9 mg/dL (ref 8.4–10.5)
Chloride: 103 meq/L (ref 96–112)
Creatinine, Ser: 0.8 mg/dL (ref 0.40–1.20)
GFR: 90.18 mL/min (ref >60.00)
Glucose, Bld: 95 mg/dL (ref 70–99)
Potassium: 4 meq/L (ref 3.5–5.1)
Sodium: 137 meq/L (ref 135–145)
Total Bilirubin: 0.3 mg/dL (ref 0.2–1.2)
Total Protein: 7.8 g/dL (ref 6.0–8.3)

## 2023-11-27 LAB — CBC WITH DIFFERENTIAL/PLATELET
Basophils Absolute: 0 10*3/uL (ref 0.0–0.1)
Basophils Relative: 0.8 % (ref 0.0–3.0)
Eosinophils Absolute: 0.1 10*3/uL (ref 0.0–0.7)
Eosinophils Relative: 1.1 % (ref 0.0–5.0)
HCT: 39 % (ref 36.0–46.0)
Hemoglobin: 12.7 g/dL (ref 12.0–15.0)
Lymphocytes Relative: 24.7 % (ref 12.0–46.0)
Lymphs Abs: 1.1 10*3/uL (ref 0.7–4.0)
MCHC: 32.5 g/dL (ref 30.0–36.0)
MCV: 76.9 fL — ABNORMAL LOW (ref 78.0–100.0)
Monocytes Absolute: 0.3 10*3/uL (ref 0.1–1.0)
Monocytes Relative: 6.1 % (ref 3.0–12.0)
Neutro Abs: 3 10*3/uL (ref 1.4–7.7)
Neutrophils Relative %: 67.3 % (ref 43.0–77.0)
Platelets: 360 10*3/uL (ref 150.0–400.0)
RBC: 5.08 Mil/uL (ref 3.87–5.11)
RDW: 18.7 % — ABNORMAL HIGH (ref 11.5–15.5)
WBC: 4.5 10*3/uL (ref 4.0–10.5)

## 2023-11-27 LAB — NO CULTURE INDICATED

## 2023-11-27 LAB — INSULIN, RANDOM: Insulin: 76.3 u[IU]/mL — ABNORMAL HIGH

## 2023-11-27 LAB — B12 AND FOLATE PANEL
Folate: 10.7 ng/mL (ref >5.9)
Vitamin B-12: 293 pg/mL (ref 211–911)

## 2023-11-27 LAB — URINALYSIS W MICROSCOPIC + REFLEX CULTURE
Bacteria, UA: NONE SEEN /[HPF]
Bilirubin Urine: NEGATIVE
Glucose, UA: NEGATIVE
Hgb urine dipstick: NEGATIVE
Hyaline Cast: NONE SEEN /[LPF]
Ketones, ur: NEGATIVE
Leukocyte Esterase: NEGATIVE
Nitrites, Initial: NEGATIVE
Protein, ur: NEGATIVE
RBC / HPF: NONE SEEN /[HPF] (ref 0–2)
Specific Gravity, Urine: 1.004 (ref 1.001–1.035)
Squamous Epithelial / HPF: NONE SEEN /[HPF] (ref <=5)
WBC, UA: NONE SEEN /[HPF] (ref 0–5)
pH: 7 (ref 5.0–8.0)

## 2023-11-27 LAB — THYROID PANEL WITH TSH
Free Thyroxine Index: 2 (ref 1.4–3.8)
T3 Uptake: 26 % (ref 22–35)
T4, Total: 7.6 ug/dL (ref 5.1–11.9)
TSH: 1.78 m[IU]/L

## 2023-11-27 LAB — IRON,TIBC AND FERRITIN PANEL
%SAT: 12 % — ABNORMAL LOW (ref 16–45)
Ferritin: 15 ng/mL — ABNORMAL LOW (ref 16–232)
Iron: 46 ug/dL (ref 40–190)
TIBC: 381 ug/dL (ref 250–450)

## 2023-11-27 LAB — HEMOGLOBIN A1C: Hgb A1c MFr Bld: 6 % (ref 4.6–6.5)

## 2023-12-24 DIAGNOSIS — L2089 Other atopic dermatitis: Secondary | ICD-10-CM | POA: Diagnosis not present

## 2023-12-24 DIAGNOSIS — L700 Acne vulgaris: Secondary | ICD-10-CM | POA: Diagnosis not present

## 2024-01-05 ENCOUNTER — Ambulatory Visit
Admission: RE | Admit: 2024-01-05 | Discharge: 2024-01-05 | Disposition: A | Discharge status: Home / Self Care | Payer: BC Managed Care – PPO | Source: Ambulatory Visit

## 2024-01-05 ENCOUNTER — Encounter: Payer: Self-pay | Admitting: Internal Medicine

## 2024-01-05 VITALS — BP 126/86 | HR 86 | Temp 98.3°F | Resp 18

## 2024-01-05 DIAGNOSIS — M5442 Lumbago with sciatica, left side: Secondary | ICD-10-CM

## 2024-01-05 DIAGNOSIS — M533 Sacrococcygeal disorders, not elsewhere classified: Secondary | ICD-10-CM

## 2024-01-05 MED ORDER — DICLOFENAC SODIUM 75 MG PO TBEC: 75.0000 mg | DELAYED_RELEASE_TABLET | Freq: Two times a day (BID) | ORAL | 0 refills | Status: AC

## 2024-01-05 MED ORDER — DEXAMETHASONE SODIUM PHOSPHATE 10 MG/ML IJ SOLN
10.0000 mg | Freq: Once | INTRAMUSCULAR | Status: AC
  Administered 2024-01-05: 10 mg via INTRAMUSCULAR

## 2024-01-05 MED ORDER — BACLOFEN 10 MG PO TABS: 10.0000 mg | ORAL_TABLET | Freq: Three times a day (TID) | ORAL | 0 refills | Status: AC | PRN

## 2024-01-05 NOTE — ED Triage Notes (Signed)
 Patient presents with c/o low back pain x three weeks. Denies any other symptoms. Patient states she ibuprofen and heat/ice for the symptoms. Denies injury/trauma.
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
## 2024-01-05 NOTE — Discharge Instructions (Addendum)
 BACK PAIN: Stressed avoiding painful activities . RICE (REST, ICE, COMPRESSION, ELEVATION) guidelines reviewed. May alternate ice and heat. Consider use of muscle rubs, Salonpas patches, etc. Use medications as directed including muscle relaxers if prescribed. Take anti-inflammatory medications as prescribed or OTC NSAIDs/Tylenol.  F/u with PCP or [facility] if no better in 4-6 weeks (after initial pain) or worsening symptoms.  BACK PAIN RED FLAGS: If the back pain acutely worsens or there are any red flag symptoms such as numbness/tingling, leg weakness, saddle anesthesia, or loss of bowel/bladder control, go immediately to the ER. Follow up with [place] as scheduled or sooner if the pain does not begin to resolve or if it worsens before the follow up    Results Physiotherapy Dan Humphreys [facility] [address]  [phone]  Exercises:  Pigeon stretch, happy baby, piriformis, clam shells, sciatic nerve glides. You can look these up on youtube
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
## 2024-01-15 ENCOUNTER — Ambulatory Visit: Payer: BC Managed Care – PPO | Admitting: Obstetrics and Gynecology

## 2024-01-15 ENCOUNTER — Encounter: Payer: Self-pay | Admitting: Obstetrics and Gynecology

## 2024-01-15 VITALS — BP 124/76 | HR 108 | Ht 67.0 in | Wt 182.0 lb

## 2024-01-15 DIAGNOSIS — L700 Acne vulgaris: Secondary | ICD-10-CM | POA: Diagnosis not present

## 2024-01-15 DIAGNOSIS — N951 Menopausal and female climacteric states: Secondary | ICD-10-CM | POA: Diagnosis not present

## 2024-01-15 DIAGNOSIS — Z1231 Encounter for screening mammogram for malignant neoplasm of breast: Secondary | ICD-10-CM

## 2024-01-15 NOTE — Patient Instructions (Signed)
 I value your feedback and you entrusting [place] with your care. If you get a [place] patient survey, I would appreciate you taking the time to let us know about your experience today. Thank you! ? ? ?

## 2024-01-16 DIAGNOSIS — M5441 Lumbago with sciatica, right side: Secondary | ICD-10-CM | POA: Diagnosis not present

## 2024-01-16 DIAGNOSIS — M9903 Segmental and somatic dysfunction of lumbar region: Secondary | ICD-10-CM | POA: Diagnosis not present

## 2024-01-16 DIAGNOSIS — M5442 Lumbago with sciatica, left side: Secondary | ICD-10-CM | POA: Diagnosis not present

## 2024-01-19 DIAGNOSIS — M5442 Lumbago with sciatica, left side: Secondary | ICD-10-CM | POA: Diagnosis not present

## 2024-01-19 DIAGNOSIS — M9903 Segmental and somatic dysfunction of lumbar region: Secondary | ICD-10-CM | POA: Diagnosis not present

## 2024-01-19 DIAGNOSIS — M5441 Lumbago with sciatica, right side: Secondary | ICD-10-CM | POA: Diagnosis not present

## 2024-01-21 LAB — FSH/LH
FSH: 5.7 m[IU]/mL
LH: 4 m[IU]/mL

## 2024-01-21 LAB — TESTOSTERONE,FREE AND TOTAL
Testosterone, Free: 0.5 pg/mL (ref 0.0–4.2)
Testosterone: 4 ng/dL (ref 4–50)

## 2024-01-21 LAB — ESTRADIOL: Estradiol: 66.8 pg/mL

## 2024-01-22 ENCOUNTER — Encounter: Payer: Self-pay | Admitting: Obstetrics and Gynecology

## 2024-01-22 DIAGNOSIS — N951 Menopausal and female climacteric states: Secondary | ICD-10-CM

## 2024-01-22 MED ORDER — NORETHINDRONE-ETH ESTRADIOL 1-5 MG-MCG PO TABS: 1.0000 | ORAL_TABLET | Freq: Every day | ORAL | 0 refills | Status: AC

## 2024-01-23 DIAGNOSIS — M5441 Lumbago with sciatica, right side: Secondary | ICD-10-CM | POA: Diagnosis not present

## 2024-01-23 DIAGNOSIS — M5442 Lumbago with sciatica, left side: Secondary | ICD-10-CM | POA: Diagnosis not present

## 2024-01-23 DIAGNOSIS — M9903 Segmental and somatic dysfunction of lumbar region: Secondary | ICD-10-CM | POA: Diagnosis not present

## 2024-01-27 DIAGNOSIS — Z1231 Encounter for screening mammogram for malignant neoplasm of breast: Secondary | ICD-10-CM | POA: Diagnosis not present

## 2024-01-27 LAB — HM MAMMOGRAPHY

## 2024-02-03 ENCOUNTER — Encounter: Payer: Self-pay | Admitting: Obstetrics and Gynecology

## 2024-02-04 DIAGNOSIS — M5441 Lumbago with sciatica, right side: Secondary | ICD-10-CM | POA: Diagnosis not present

## 2024-02-04 DIAGNOSIS — M9903 Segmental and somatic dysfunction of lumbar region: Secondary | ICD-10-CM | POA: Diagnosis not present

## 2024-02-04 DIAGNOSIS — M5442 Lumbago with sciatica, left side: Secondary | ICD-10-CM | POA: Diagnosis not present

## 2024-02-09 DIAGNOSIS — L700 Acne vulgaris: Secondary | ICD-10-CM | POA: Diagnosis not present

## 2024-02-09 DIAGNOSIS — L2089 Other atopic dermatitis: Secondary | ICD-10-CM | POA: Diagnosis not present

## 2024-02-20 DIAGNOSIS — M5441 Lumbago with sciatica, right side: Secondary | ICD-10-CM | POA: Diagnosis not present

## 2024-02-20 DIAGNOSIS — M9903 Segmental and somatic dysfunction of lumbar region: Secondary | ICD-10-CM | POA: Diagnosis not present

## 2024-02-20 DIAGNOSIS — M5442 Lumbago with sciatica, left side: Secondary | ICD-10-CM | POA: Diagnosis not present

## 2024-02-25 ENCOUNTER — Ambulatory Visit: Payer: BC Managed Care – PPO | Admitting: Family Medicine

## 2024-02-25 ENCOUNTER — Encounter: Payer: Self-pay | Admitting: Family Medicine

## 2024-02-25 VITALS — BP 124/78 | HR 103 | Temp 98.0°F | Wt 179.8 lb

## 2024-02-25 DIAGNOSIS — E88819 Insulin resistance, unspecified: Secondary | ICD-10-CM

## 2024-02-25 DIAGNOSIS — L700 Acne vulgaris: Secondary | ICD-10-CM | POA: Diagnosis not present

## 2024-02-25 DIAGNOSIS — D500 Iron deficiency anemia secondary to blood loss (chronic): Secondary | ICD-10-CM

## 2024-02-25 DIAGNOSIS — R7303 Prediabetes: Secondary | ICD-10-CM | POA: Diagnosis not present

## 2024-02-25 LAB — POCT GLYCOSYLATED HEMOGLOBIN (HGB A1C): Hemoglobin A1C: 5.3 % (ref 4.0–5.6)
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
## 2024-03-19 DIAGNOSIS — M9903 Segmental and somatic dysfunction of lumbar region: Secondary | ICD-10-CM | POA: Diagnosis not present

## 2024-03-19 DIAGNOSIS — M5442 Lumbago with sciatica, left side: Secondary | ICD-10-CM | POA: Diagnosis not present

## 2024-03-19 DIAGNOSIS — M5441 Lumbago with sciatica, right side: Secondary | ICD-10-CM | POA: Diagnosis not present

## 2024-04-14 ENCOUNTER — Other Ambulatory Visit: Payer: Self-pay | Admitting: Obstetrics and Gynecology

## 2024-04-14 DIAGNOSIS — N951 Menopausal and female climacteric states: Secondary | ICD-10-CM

## 2024-06-10 DIAGNOSIS — L738 Other specified follicular disorders: Secondary | ICD-10-CM | POA: Diagnosis not present

## 2024-06-10 DIAGNOSIS — L2089 Other atopic dermatitis: Secondary | ICD-10-CM | POA: Diagnosis not present

## 2024-06-10 DIAGNOSIS — L700 Acne vulgaris: Secondary | ICD-10-CM | POA: Diagnosis not present

## 2024-06-16 DIAGNOSIS — L700 Acne vulgaris: Secondary | ICD-10-CM | POA: Diagnosis not present

## 2024-10-29 ENCOUNTER — Ambulatory Visit
Admission: RE | Admit: 2024-10-29 | Discharge: 2024-10-29 | Disposition: A | Discharge status: Home / Self Care | Attending: Emergency Medicine | Admitting: Emergency Medicine

## 2024-10-29 VITALS — BP 126/83 | HR 83 | Temp 97.9°F | Resp 14 | Ht 67.0 in | Wt 179.9 lb

## 2024-10-29 DIAGNOSIS — K5900 Constipation, unspecified: Secondary | ICD-10-CM | POA: Diagnosis not present

## 2024-10-29 MED ORDER — ONDANSETRON HCL 4 MG PO TABS: 4.0000 mg | ORAL_TABLET | Freq: Three times a day (TID) | ORAL | 0 refills | Status: AC | PRN

## 2024-10-29 NOTE — ED Triage Notes (Signed)
 Patient reports constipation since September.  Patient states that she has been taking Mirralax.  Patient states that for the past week she has had difficulty passing stools and only liquid comes out.  Patient reports abdominal discomfort and nausea.
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
## 2024-10-29 NOTE — Discharge Instructions (Addendum)
 Please follow up with PCP/may need GI consult If you have fever, vomiting, worsening abdominal pain go to Er for labs/imaging. Avoid spicy,greasy fried foods, avoid caffeine, alcohol

## 2024-11-19 ENCOUNTER — Ambulatory Visit: Admitting: Nurse Practitioner

## 2024-11-22 ENCOUNTER — Encounter: Payer: Self-pay | Admitting: Family Medicine

## 2024-11-22 ENCOUNTER — Ambulatory Visit: Admitting: Family Medicine

## 2024-11-22 VITALS — BP 112/80 | HR 81 | Temp 98.0°F | Ht 67.0 in | Wt 179.2 lb

## 2024-11-22 DIAGNOSIS — K5904 Chronic idiopathic constipation: Secondary | ICD-10-CM

## 2024-11-22 DIAGNOSIS — K5909 Other constipation: Secondary | ICD-10-CM

## 2024-11-22 MED ORDER — LUBIPROSTONE 8 MCG PO CAPS: 8.0000 ug | ORAL_CAPSULE | Freq: Two times a day (BID) | ORAL | 2 refills | Status: DC

## 2024-11-22 NOTE — Patient Instructions (Addendum)
 It was very nice to see you today!  VISIT SUMMARY: During your visit, we discussed your chronic constipation and gastrointestinal symptoms, including abdominal cramping, early satiety, and bloating. We also addressed your hemorrhoids associated with straining during bowel movements.  YOUR PLAN: CHRONIC CONSTIPATION WITH POSSIBLE IRRITABLE BOWEL SYNDROME: You have been experiencing chronic constipation for four months with intermittent abdominal cramping, early satiety, and bloating. We are considering irritable bowel syndrome and inflammatory bowel disease as possible causes. -We have ordered blood tests including CBC, CRP, fecal calprotectin, and a celiac screen. -A CT scan of your abdomen and pelvis with IV and oral contrast has been ordered. -We have also ordered a TSH test. -You have been referred to a gastroenterologist for further assessment. -We provided you with literature about lofodimab. -You have been prescribed Amtesa 8 micrograms to be taken twice daily with meals. -We will follow up in one month to assess your response to lupipristine.  HEMORRHOIDS: You have hemorrhoids associated with straining during bowel movements. -We provided you with literature about lofodimab.  Return in about 1 month (around 12/23/2024) for constipation.   Take care, Arvella Hummer, MD, MS   PLEASE NOTE:  If you had any lab tests, please let us  know if you have not heard back within a few days. You may see your results on mychart before we have a chance to review them but we will give you a call once they are reviewed by us .   If we ordered any referrals today, please let us  know if you have not heard from their office within the next week.   If you had any urgent prescriptions sent in today, please check with the pharmacy within an hour of our visit to make sure the prescription was transmitted appropriately.   Please try these tips to maintain a healthy lifestyle:  Eat at least 3 REAL meals  and 1-2 snacks per day.  Aim for no more than 5 hours between eating.  If you eat breakfast, please do so within one hour of getting up.   Each meal should contain half fruits/vegetables, one quarter protein, and one quarter carbs (no bigger than a computer mouse)  Cut down on sweet beverages. This includes juice, soda, and sweet tea.   Drink at least 1 glass of water with each meal and aim for at least 8 glasses per day  Exercise at least 150 minutes every week.
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
## 2024-11-23 MED ORDER — TRULANCE 3 MG PO TABS: 1.0000 | ORAL_TABLET | Freq: Every day | ORAL | 11 refills | Status: AC

## 2024-11-23 NOTE — Telephone Encounter (Signed)
 Called [facility] pharmacy and they stated that pt will have to try Trulance  before insurance will cover the lubiprostone .

## 2024-11-24 NOTE — Telephone Encounter (Signed)
 Pt was notified of medication being sent in.

## 2024-11-25 ENCOUNTER — Other Ambulatory Visit

## 2024-11-25 DIAGNOSIS — K5909 Other constipation: Secondary | ICD-10-CM

## 2024-11-25 LAB — CBC
HCT: 41.9 % (ref 36.0–46.0)
Hemoglobin: 14.2 g/dL (ref 12.0–15.0)
MCHC: 34 g/dL (ref 30.0–36.0)
MCV: 83.5 fl (ref 78.0–100.0)
Platelets: 322 K/uL (ref 150.0–400.0)
RBC: 5.01 Mil/uL (ref 3.87–5.11)
RDW: 12.7 % (ref 11.5–15.5)
WBC: 4.1 K/uL (ref 4.0–10.5)

## 2024-11-25 LAB — C-REACTIVE PROTEIN: CRP: 1.4 mg/dL (ref 1.0–20.0)

## 2024-11-27 LAB — CELIAC PANEL 10
Deamidated Gliadin Abs, IgA: 6 U (ref 0–19)
Deamidated Gliadin Abs, IgG: 1 U (ref 0–19)
Endomysial IgA: NEGATIVE
Immunoglobulin A, (IgA) QN, Serum: 474 mg/dL — ABNORMAL HIGH (ref 87–352)
t-Transglutaminase (tTG) IgA: <2 U/mL (ref 0–3)
t-Transglutaminase (tTG) IgG: <2 U/mL (ref 0–5)

## 2024-11-27 LAB — CELIAC DISEASE PANEL
(tTG) Ab, IgA: <1 U/mL
(tTG) Ab, IgG: <1 U/mL
Deamidated Gliadin Abs, IgG: <1 U/mL
Gliadin IgA: 3.9 U/mL
Immunoglobulin A: 485 mg/dL — ABNORMAL HIGH (ref 47–310)

## 2024-11-27 LAB — TSH RFX ON ABNORMAL TO FREE T4: TSH: 1.31 u[IU]/mL (ref 0.450–4.500)

## 2024-11-29 ENCOUNTER — Ambulatory Visit: Payer: Self-pay | Admitting: Family Medicine

## 2024-12-04 NOTE — Telephone Encounter (Signed)
 Please disregard my prior message.  Patient has mildly elevated IgA levels.  This is very common finding and does not represent any form of disease.

## 2024-12-09 ENCOUNTER — Inpatient Hospital Stay: Admission: RE | Admit: 2024-12-09 | Source: Ambulatory Visit
